# Patient Record
Sex: Female | Born: 1945
Health system: Southern US, Community
[De-identification: ages and names within clinical notes are randomized; demographics above are authoritative.]

## PROBLEM LIST (undated history)

## (undated) DIAGNOSIS — C439 Malignant melanoma of skin, unspecified: Secondary | ICD-10-CM

## (undated) DIAGNOSIS — M8580 Other specified disorders of bone density and structure, unspecified site: Secondary | ICD-10-CM

## (undated) HISTORY — DX: Other specified disorders of bone density and structure, unspecified site: M85.80

## (undated) HISTORY — DX: Malignant melanoma of skin, unspecified: C43.9

## (undated) HISTORY — PX: COLONOSCOPY: SHX174

---

## 1999-02-19 ENCOUNTER — Other Ambulatory Visit: Admission: RE | Admit: 1999-02-19 | Discharge: 1999-02-19 | Payer: Self-pay | Admitting: *Deleted

## 2000-03-08 ENCOUNTER — Other Ambulatory Visit: Admission: RE | Admit: 2000-03-08 | Discharge: 2000-03-08 | Payer: Self-pay | Admitting: *Deleted

## 2007-10-11 DIAGNOSIS — C439 Malignant melanoma of skin, unspecified: Secondary | ICD-10-CM

## 2007-10-11 HISTORY — PX: MELANOMA EXCISION: SHX5266

## 2007-10-11 HISTORY — DX: Malignant melanoma of skin, unspecified: C43.9

## 2008-01-22 ENCOUNTER — Ambulatory Visit: Payer: Self-pay | Admitting: Internal Medicine

## 2010-01-04 DIAGNOSIS — M810 Age-related osteoporosis without current pathological fracture: Secondary | ICD-10-CM | POA: Insufficient documentation

## 2010-01-04 DIAGNOSIS — Z87448 Personal history of other diseases of urinary system: Secondary | ICD-10-CM | POA: Insufficient documentation

## 2010-03-31 DIAGNOSIS — G4700 Insomnia, unspecified: Secondary | ICD-10-CM | POA: Insufficient documentation

## 2010-03-31 DIAGNOSIS — C4330 Malignant melanoma of unspecified part of face: Secondary | ICD-10-CM | POA: Insufficient documentation

## 2012-04-11 ENCOUNTER — Encounter: Payer: Self-pay | Admitting: Internal Medicine

## 2012-05-09 ENCOUNTER — Ambulatory Visit (AMBULATORY_SURGERY_CENTER): Payer: Medicare Other | Admitting: *Deleted

## 2012-05-09 VITALS — Ht 63.0 in | Wt 142.2 lb

## 2012-05-09 DIAGNOSIS — Z1211 Encounter for screening for malignant neoplasm of colon: Secondary | ICD-10-CM

## 2012-05-09 MED ORDER — MOVIPREP 100 G PO SOLR: ORAL | Status: DC

## 2012-05-23 ENCOUNTER — Encounter: Payer: Self-pay | Admitting: Internal Medicine

## 2012-05-28 ENCOUNTER — Encounter: Payer: Self-pay | Admitting: Internal Medicine

## 2012-05-28 ENCOUNTER — Ambulatory Visit (AMBULATORY_SURGERY_CENTER): Payer: Medicare Other | Admitting: Internal Medicine

## 2012-05-28 VITALS — BP 141/81 | HR 83 | Temp 98.5°F | Resp 17 | Ht 63.0 in | Wt 142.0 lb

## 2012-05-28 DIAGNOSIS — K5730 Diverticulosis of large intestine without perforation or abscess without bleeding: Secondary | ICD-10-CM

## 2012-05-28 DIAGNOSIS — Z1211 Encounter for screening for malignant neoplasm of colon: Secondary | ICD-10-CM | POA: Diagnosis not present

## 2012-05-28 MED ORDER — SODIUM CHLORIDE 0.9 % IV SOLN: 500.0000 mL | INTRAVENOUS | Status: DC

## 2012-05-28 NOTE — Patient Instructions (Signed)
Diverticulosis seen today, try to follow high fiber diet. See handouts on diverticulosis and high fiber diet. Repeat colonoscopy in 10 years. Call us with any questions or concerns. Thank you!!   YOU HAD AN ENDOSCOPIC PROCEDURE TODAY AT THE [facility]: Refer to the procedure report that was given to you for any specific questions about what was found during the examination.  If the procedure report does not answer your questions, please call your gastroenterologist to clarify.  If you requested that your care partner not be given the details of your procedure findings, then the procedure report has been included in a sealed envelope for you to review at your convenience later.  YOU SHOULD EXPECT: Some feelings of bloating in the abdomen. Passage of more gas than usual.  Walking can help get rid of the air that was put into your GI tract during the procedure and reduce the bloating. If you had a lower endoscopy (such as a colonoscopy or flexible sigmoidoscopy) you may notice spotting of blood in your stool or on the toilet paper. If you underwent a bowel prep for your procedure, then you may not have a normal bowel movement for a few days.  DIET: Your first meal following the procedure should be a light meal and then it is ok to progress to your normal diet.  A half-sandwich or bowl of soup is an example of a good first meal.  Heavy or fried foods are harder to digest and may make you feel nauseous or bloated.  Likewise meals heavy in dairy and vegetables can cause extra gas to form and this can also increase the bloating.  Drink plenty of fluids but you should avoid alcoholic beverages for 24 hours.  ACTIVITY: Your care partner should take you home directly after the procedure.  You should plan to take it easy, moving slowly for the rest of the day.  You can resume normal activity the day after the procedure however you should NOT DRIVE or use heavy machinery for 24 hours (because of the  sedation medicines used during the test).    SYMPTOMS TO REPORT IMMEDIATELY: A gastroenterologist can be reached at any hour.  During normal business hours, 8:30 AM to 5:00 PM Monday through Friday, call [phone].  After hours and on weekends, please call the GI answering service at [phone] who will take a message and have the physician on call contact you.   Following lower endoscopy (colonoscopy or flexible sigmoidoscopy):  Excessive amounts of blood in the stool  Significant tenderness or worsening of abdominal pains  Swelling of the abdomen that is new, acute  Fever of 100F or higher  FOLLOW UP: If any biopsies were taken you will be contacted by phone or by letter within the next 1-3 weeks.  Call your gastroenterologist if you have not heard about the biopsies in 3 weeks.  Our staff will call the home number listed on your records the next business day following your procedure to check on you and address any questions or concerns that you may have at that time regarding the information given to you following your procedure. This is a courtesy call and so if there is no answer at the home number and we have not heard from you through the emergency physician on call, we will assume that you have returned to your regular daily activities without incident.  SIGNATURES/CONFIDENTIALITY: You and/or your care partner have signed paperwork which will be entered into your electronic  medical record.  These signatures attest to the fact that that the information above on your After Visit Summary has been reviewed and is understood.  Full responsibility of the confidentiality of this discharge information lies with you and/or your care-partner.  

## 2012-05-28 NOTE — Op Note (Signed)
[facility] [address] [place], [address]   COLONOSCOPY PROCEDURE REPORT  PATIENT: Ashley, Hines  MR#: [id number] BIRTHDATE: 1946/03/12 , 65  yrs. old GENDER: Female ENDOSCOPIST: Roxy Cedar, MD REFERRED [id number] Recall PROCEDURE DATE:  05/28/2012 PROCEDURE:   Colonoscopy, screening ASA CLASS:   Class II INDICATIONS:average risk patient for colon cancer.  Index exam 2003 - negative MEDICATIONS: MAC sedation, administered by CRNA and propofol (Diprivan) 260mg  IV  DESCRIPTION OF PROCEDURE:   After the risks benefits and alternatives of the procedure were thoroughly explained, informed consent was obtained.  Digital rectal exam was performed and revealed A digital rectal exam revealed no abnormalities of the rectum.  The LB [id number]  endoscope was introduced through the anus  and advanced to the cecum, which was identified by both the appendix and ileocecal valve , limited by No adverse events experienced.   The quality of the prep was excellent, using MoviPrep . The instrument was then slowly withdrawn as the colon was fully examined.    FINDINGS:  COLON FINDINGS: Mild diverticulosis was noted in the sigmoid colon.   The colon mucosa was otherwise normal. Retroflexed views revealed internal hemorrhoid.      The time to cecum = 5:59 minutes minutes. The scope was then withdrawn in 8.13 minutes  minutes from the cecum and the procedure completed. COMPLICATIONS: There were no complications.  ENDOSCOPIC IMPRESSION: 1.   Mild diverticulosis was noted in the sigmoid colon 2.   The colon mucosa was otherwise normal  RECOMMENDATIONS: Continue current colorectal screening recommendations for "routine risk" patients with a repeat colonoscopy in 10 years.    _______________________________ eSignedRoxy Cedar, MD 05/28/2012 10:25 AM  ZD:GUYQ Timothy Lasso, MD The Patient   PATIENT NAME:  Ashley, Hines MR#: [id number]

## 2012-05-28 NOTE — Progress Notes (Signed)
Patient did not experience any of the following events: a burn prior to discharge; a fall within the facility; wrong site/side/patient/procedure/implant event; or a hospital transfer or hospital admission upon discharge from the facility. (G8907) Patient did not have preoperative order for IV antibiotic SSI prophylaxis. (G8918)  

## 2012-05-29 ENCOUNTER — Telehealth: Payer: Self-pay | Admitting: *Deleted

## 2012-05-29 NOTE — Telephone Encounter (Signed)
  Follow up Call-  Call back number 05/28/2012  Post procedure Call Back phone  # [phone]  Permission to leave phone message Yes     Patient questions:  Do you have a fever, pain , or abdominal swelling? no Pain Score  0 *  Have you tolerated food without any problems? yes  Have you been able to return to your normal activities? yes  Do you have any questions about your discharge instructions: Diet   no Medications  no Follow up visit  no  Do you have questions or concerns about your Care? no  Actions: * If pain score is 4 or above: No action needed, pain <4.  Information provided via husband.

## 2012-06-07 ENCOUNTER — Encounter: Payer: Self-pay | Admitting: Internal Medicine

## 2012-06-26 DIAGNOSIS — S5290XA Unspecified fracture of unspecified forearm, initial encounter for closed fracture: Secondary | ICD-10-CM | POA: Diagnosis not present

## 2012-06-26 DIAGNOSIS — W010XXA Fall on same level from slipping, tripping and stumbling without subsequent striking against object, initial encounter: Secondary | ICD-10-CM | POA: Diagnosis not present

## 2012-06-26 DIAGNOSIS — M810 Age-related osteoporosis without current pathological fracture: Secondary | ICD-10-CM | POA: Diagnosis not present

## 2012-06-28 DIAGNOSIS — S52599A Other fractures of lower end of unspecified radius, initial encounter for closed fracture: Secondary | ICD-10-CM | POA: Diagnosis not present

## 2012-06-29 ENCOUNTER — Other Ambulatory Visit: Payer: Self-pay | Admitting: Orthopedic Surgery

## 2012-06-29 ENCOUNTER — Encounter (HOSPITAL_BASED_OUTPATIENT_CLINIC_OR_DEPARTMENT_OTHER): Payer: Self-pay | Admitting: *Deleted

## 2012-06-29 NOTE — Progress Notes (Signed)
No cardiac or resp problems-fx wrist on tennis court-no labs needed Told to get wedding rings off

## 2012-07-02 ENCOUNTER — Ambulatory Visit (HOSPITAL_BASED_OUTPATIENT_CLINIC_OR_DEPARTMENT_OTHER)
Admission: RE | Admit: 2012-07-02 | Discharge: 2012-07-02 | Disposition: A | Discharge status: Home / Self Care | Payer: Medicare Other | Source: Ambulatory Visit | Attending: Orthopedic Surgery | Admitting: Orthopedic Surgery

## 2012-07-02 ENCOUNTER — Encounter (HOSPITAL_BASED_OUTPATIENT_CLINIC_OR_DEPARTMENT_OTHER)
Admission: RE | Disposition: A | Discharge status: Home / Self Care | Payer: Self-pay | Source: Ambulatory Visit | Attending: Orthopedic Surgery

## 2012-07-02 ENCOUNTER — Encounter (HOSPITAL_BASED_OUTPATIENT_CLINIC_OR_DEPARTMENT_OTHER): Payer: Self-pay | Admitting: Anesthesiology

## 2012-07-02 ENCOUNTER — Ambulatory Visit (HOSPITAL_BASED_OUTPATIENT_CLINIC_OR_DEPARTMENT_OTHER): Payer: Medicare Other | Admitting: Anesthesiology

## 2012-07-02 ENCOUNTER — Encounter (HOSPITAL_BASED_OUTPATIENT_CLINIC_OR_DEPARTMENT_OTHER): Payer: Self-pay

## 2012-07-02 DIAGNOSIS — Y9239 Other specified sports and athletic area as the place of occurrence of the external cause: Secondary | ICD-10-CM | POA: Insufficient documentation

## 2012-07-02 DIAGNOSIS — W010XXA Fall on same level from slipping, tripping and stumbling without subsequent striking against object, initial encounter: Secondary | ICD-10-CM | POA: Insufficient documentation

## 2012-07-02 DIAGNOSIS — Y92838 Other recreation area as the place of occurrence of the external cause: Secondary | ICD-10-CM | POA: Insufficient documentation

## 2012-07-02 DIAGNOSIS — Y9359 Activity, other involving other sports and athletics played individually: Secondary | ICD-10-CM | POA: Insufficient documentation

## 2012-07-02 DIAGNOSIS — S52599A Other fractures of lower end of unspecified radius, initial encounter for closed fracture: Secondary | ICD-10-CM | POA: Insufficient documentation

## 2012-07-02 DIAGNOSIS — M25539 Pain in unspecified wrist: Secondary | ICD-10-CM | POA: Diagnosis not present

## 2012-07-02 DIAGNOSIS — G8918 Other acute postprocedural pain: Secondary | ICD-10-CM | POA: Diagnosis not present

## 2012-07-02 LAB — POCT HEMOGLOBIN-HEMACUE: Hemoglobin: 14.4 g/dL (ref 12.0–15.0)

## 2012-07-02 SURGERY — OPEN REDUCTION INTERNAL FIXATION (ORIF) DISTAL RADIUS FRACTURE
Anesthesia: Regional | Site: Wrist | Laterality: Left | Wound class: Clean

## 2012-07-02 MED ORDER — LIDOCAINE HCL (CARDIAC) 20 MG/ML IV SOLN
INTRAVENOUS | Status: DC | PRN
  Administered 2012-07-02: 75 mg via INTRAVENOUS

## 2012-07-02 MED ORDER — ONDANSETRON HCL 4 MG/2ML IJ SOLN
INTRAMUSCULAR | Status: DC | PRN
  Administered 2012-07-02: 4 mg via INTRAVENOUS

## 2012-07-02 MED ORDER — HYDROMORPHONE HCL PF 1 MG/ML IJ SOLN: 0.2500 mg | INTRAMUSCULAR | Status: DC | PRN

## 2012-07-02 MED ORDER — OXYCODONE-ACETAMINOPHEN 7.5-500 MG PO TABS: 1.0000 | ORAL_TABLET | ORAL | Status: DC | PRN

## 2012-07-02 MED ORDER — CHLORHEXIDINE GLUCONATE 4 % EX LIQD: 60.0000 mL | Freq: Once | CUTANEOUS | Status: DC

## 2012-07-02 MED ORDER — ONDANSETRON HCL 4 MG/2ML IJ SOLN: 4.0000 mg | Freq: Four times a day (QID) | INTRAMUSCULAR | Status: DC | PRN

## 2012-07-02 MED ORDER — LACTATED RINGERS IV SOLN
INTRAVENOUS | Status: DC
  Administered 2012-07-02 (×2): via INTRAVENOUS

## 2012-07-02 MED ORDER — BUPIVACAINE-EPINEPHRINE PF 0.5-1:200000 % IJ SOLN
INTRAMUSCULAR | Status: DC | PRN
  Administered 2012-07-02: 30 mL

## 2012-07-02 MED ORDER — CEFAZOLIN SODIUM-DEXTROSE 2-3 GM-% IV SOLR
2.0000 g | INTRAVENOUS | Status: AC
  Administered 2012-07-02: 2 g via INTRAVENOUS

## 2012-07-02 MED ORDER — DEXAMETHASONE SODIUM PHOSPHATE 10 MG/ML IJ SOLN
INTRAMUSCULAR | Status: DC | PRN
  Administered 2012-07-02: 10 mg via INTRAVENOUS

## 2012-07-02 MED ORDER — FENTANYL CITRATE 0.05 MG/ML IJ SOLN
50.0000 ug | INTRAMUSCULAR | Status: DC | PRN
  Administered 2012-07-02: 50 ug via INTRAVENOUS

## 2012-07-02 MED ORDER — MIDAZOLAM HCL 2 MG/2ML IJ SOLN
1.0000 mg | INTRAMUSCULAR | Status: DC | PRN
  Administered 2012-07-02: 1 mg via INTRAVENOUS

## 2012-07-02 MED ORDER — PROPOFOL 10 MG/ML IV BOLUS
INTRAVENOUS | Status: DC | PRN
  Administered 2012-07-02: 200 mg via INTRAVENOUS

## 2012-07-02 SURGICAL SUPPLY — 67 items
BANDAGE GAUZE ELAST BULKY 4 IN (GAUZE/BANDAGES/DRESSINGS) ×2 IMPLANT
BIT DRILL 2 FAST STEP (BIT) ×2 IMPLANT
BIT DRILL 2.5X4 QC (BIT) ×2 IMPLANT
BLADE MINI RND TIP GREEN BEAV (BLADE) IMPLANT
BLADE SURG 15 STRL LF DISP TIS (BLADE) ×1 IMPLANT
BLADE SURG 15 STRL SS (BLADE) ×2
BNDG CMPR 9X4 STRL LF SNTH (GAUZE/BANDAGES/DRESSINGS) ×1
BNDG COHESIVE 3X5 TAN STRL LF (GAUZE/BANDAGES/DRESSINGS) ×2 IMPLANT
BNDG ESMARK 4X9 LF (GAUZE/BANDAGES/DRESSINGS) ×2 IMPLANT
CHLORAPREP W/TINT 26ML (MISCELLANEOUS) ×2 IMPLANT
CLOTH BEACON ORANGE TIMEOUT ST (SAFETY) ×2 IMPLANT
CORDS BIPOLAR (ELECTRODE) ×2 IMPLANT
COVER MAYO STAND STRL (DRAPES) ×2 IMPLANT
COVER TABLE BACK 60X90 (DRAPES) ×2 IMPLANT
CUFF TOURNIQUET SINGLE 18IN (TOURNIQUET CUFF) ×2 IMPLANT
DECANTER SPIKE VIAL GLASS SM (MISCELLANEOUS) IMPLANT
DRAPE EXTREMITY T 121X128X90 (DRAPE) ×2 IMPLANT
DRAPE OEC MINIVIEW 54X84 (DRAPES) ×2 IMPLANT
DRAPE SURG 17X23 STRL (DRAPES) ×2 IMPLANT
DRSG KUZMA FLUFF (GAUZE/BANDAGES/DRESSINGS) IMPLANT
ELECT REM PT RETURN 9FT ADLT (ELECTROSURGICAL)
ELECTRODE REM PT RTRN 9FT ADLT (ELECTROSURGICAL) IMPLANT
GAUZE XEROFORM 1X8 LF (GAUZE/BANDAGES/DRESSINGS) ×2 IMPLANT
GLOVE BIO SURGEON STRL SZ 6.5 (GLOVE) ×4 IMPLANT
GLOVE BIOGEL PI IND STRL 7.0 (GLOVE) ×2 IMPLANT
GLOVE BIOGEL PI IND STRL 8.5 (GLOVE) ×1 IMPLANT
GLOVE BIOGEL PI INDICATOR 7.0 (GLOVE) ×2
GLOVE BIOGEL PI INDICATOR 8.5 (GLOVE) ×1
GLOVE SURG ORTHO 8.0 STRL STRW (GLOVE) ×2 IMPLANT
GOWN BRE IMP PREV XXLGXLNG (GOWN DISPOSABLE) ×2 IMPLANT
GOWN PREVENTION PLUS XLARGE (GOWN DISPOSABLE) ×4 IMPLANT
KWIRE 4.0 X .045IN (WIRE) IMPLANT
NS IRRIG 1000ML POUR BTL (IV SOLUTION) ×2 IMPLANT
PACK BASIN DAY SURGERY FS (CUSTOM PROCEDURE TRAY) ×2 IMPLANT
PAD CAST 3X4 CTTN HI CHSV (CAST SUPPLIES) ×1 IMPLANT
PADDING CAST ABS 3INX4YD NS (CAST SUPPLIES)
PADDING CAST ABS 4INX4YD NS (CAST SUPPLIES) ×1
PADDING CAST ABS COTTON 3X4 (CAST SUPPLIES) IMPLANT
PADDING CAST ABS COTTON 4X4 ST (CAST SUPPLIES) ×1 IMPLANT
PADDING CAST COTTON 3X4 STRL (CAST SUPPLIES) ×2
PEG FULLY THREADED 2.5X22MM (Peg) ×2 IMPLANT
PEG SUBCHONDRAL SMOOTH 2.0X18 (Peg) ×14 IMPLANT
PENCIL BUTTON HOLSTER BLD 10FT (ELECTRODE) IMPLANT
PLATE STAN 24.4X59.5 LT (Plate) ×2 IMPLANT
SCREW BN 12X3.5XNS CORT TI (Screw) ×2 IMPLANT
SCREW BN 13X3.5XNS CORT TI (Screw) ×2 IMPLANT
SCREW CORT 3.5X12 (Screw) ×4 IMPLANT
SCREW CORT 3.5X13 (Screw) ×4 IMPLANT
SCREW PEG 2.5X22 NONLOCK (Screw) ×2 IMPLANT
SLEEVE SCD COMPRESS KNEE MED (MISCELLANEOUS) ×2 IMPLANT
SPLINT PLASTER CAST XFAST 3X15 (CAST SUPPLIES) ×10 IMPLANT
SPLINT PLASTER XTRA FASTSET 3X (CAST SUPPLIES) ×10
SPONGE GAUZE 4X4 12PLY (GAUZE/BANDAGES/DRESSINGS) ×2 IMPLANT
STOCKINETTE 4X48 STRL (DRAPES) ×2 IMPLANT
SUT VIC AB 0 CT1 27 (SUTURE)
SUT VIC AB 0 CT1 27XBRD ANBCTR (SUTURE) IMPLANT
SUT VIC AB 2-0 SH 27 (SUTURE)
SUT VIC AB 2-0 SH 27XBRD (SUTURE) IMPLANT
SUT VIC AB 3-0 FS2 27 (SUTURE) ×2 IMPLANT
SUT VICRYL 4-0 PS2 18IN ABS (SUTURE) IMPLANT
SUT VICRYL RAPID 5 0 P 3 (SUTURE) IMPLANT
SUT VICRYL RAPIDE 4/0 PS 2 (SUTURE) ×4 IMPLANT
SYR BULB 3OZ (MISCELLANEOUS) ×2 IMPLANT
SYR CONTROL 10ML LL (SYRINGE) IMPLANT
TOWEL OR 17X24 6PK STRL BLUE (TOWEL DISPOSABLE) ×2 IMPLANT
UNDERPAD 30X30 INCONTINENT (UNDERPADS AND DIAPERS) ×2 IMPLANT
WATER STERILE IRR 1000ML POUR (IV SOLUTION) IMPLANT

## 2012-07-02 NOTE — Transfer of Care (Signed)
Immediate Anesthesia Transfer of Care Note  Patient: Ashley Hines  Procedure(s) Performed: Procedure(s) (LRB) with comments: OPEN REDUCTION INTERNAL FIXATION (ORIF) DISTAL RADIAL FRACTURE (Left)  Patient Location: PACU  Anesthesia Type: General  Level of Consciousness: sedated  Airway & Oxygen Therapy: Patient Spontanous Breathing and Patient connected to face mask oxygen  Post-op Assessment: Report given to PACU RN and Post -op Vital signs reviewed and stable  Post vital signs: Reviewed and stable  Complications: No apparent anesthesia complications

## 2012-07-02 NOTE — Op Note (Signed)
Dictation Number [id number]

## 2012-07-02 NOTE — Progress Notes (Signed)
Assisted Dr. Hodierne with left, ultrasound guided, interscalene  block. Side rails up, monitors on throughout procedure. See vital signs in flow sheet. Tolerated Procedure well. 

## 2012-07-02 NOTE — Brief Op Note (Signed)
07/02/2012  3:55 PM  PATIENT:  Ashley Hines  66 y.o. female  PRE-OPERATIVE DIAGNOSIS:  distal radius comminuted fracture, left   POST-OPERATIVE DIAGNOSIS:  distal radius comminuted fracture, left   PROCEDURE:  Procedure(s) (LRB) with comments: OPEN REDUCTION INTERNAL FIXATION (ORIF) DISTAL RADIAL FRACTURE (Left)  SURGEON:  Surgeon(s) and Role:    * Nicki Reaper, MD - Primary  PHYSICIAN ASSISTANT:   ASSISTANTS: none   ANESTHESIA:   regional and general  EBL:  Total I/O In: 1000 [I.V.:1000] Out: -   BLOOD ADMINISTERED:none  DRAINS: none   LOCAL MEDICATIONS USED:  NONE  SPECIMEN:  No Specimen  DISPOSITION OF SPECIMEN:  N/A  COUNTS:  YES  TOURNIQUET:   Total Tourniquet Time Documented: Upper Arm (Left) - 61 minutes  DICTATION: .Other Dictation: Dictation Number [id number]  PLAN OF CARE: Discharge to home after PACU  PATIENT DISPOSITION:  PACU - hemodynamically stable.

## 2012-07-02 NOTE — H&P (Signed)
Ashley Hines is a 66 year-old right-hand dominant female referred by Dr. Gardiner [facility]) for consultation with respect to fracture of her distal radius left hand.  She suffered a fall while playing tennis on 06/26/12 and was seen at [facility], put in a brace and referred.  X-rays revealed comminuted intraarticular fracture of her distal radius with displacement.  She has no prior history of injuries. She is not given any medicine to take.  She complains of a dull aching throbbing pain.  She has been taking Advil.  She has no history of diabetes, thyroid problems, arthritis or gout.  ALLERGIES:   None.  MEDICATIONS:    Fosamax.  SURGICAL HISTORY:    C-sections x 2.  FAMILY MEDICAL HISTORY:   Negative.  SOCIAL HISTORY:    She does not smoke, drinks socially. She is married, housewife.  REVIEW OF SYSTEMS:    Negative 14 points. Ashley Hines is an 66 y.o. female.   Chief Complaint: Fracture left distal radius HPI: see above  Past Medical History  Diagnosis Date  . Osteopenia   . Melanoma 2009    face    Past Surgical History  Procedure Date  . Cesarean section 1983, 1986  . Melanoma excision 2009    face  . Colonoscopy     Family History  Problem Relation Age of Onset  . Colon cancer Neg Hx   . Stomach cancer Neg Hx    Social History:  reports that she has never smoked. She has never used smokeless tobacco. She reports that she drinks about 4.2 ounces of alcohol per week. She reports that she does not use illicit drugs.  Allergies: No Known Allergies  Medications Prior to Admission  Medication Sig Dispense Refill  . acetaminophen (TYLENOL) 500 MG tablet Take 1,000 mg by mouth every 6 (six) hours as needed.      Marland Kitchen alendronate (FOSAMAX) 70 MG tablet Take 70 mg by mouth every 7 (seven) days.       . B Complex Vitamins (B COMPLEX 100 PO) Take by mouth daily.      . calcium carbonate (OS-CAL) 600 MG TABS Take 600 mg by mouth 2 (two) times daily with a meal.       . Cranberry-Cholecalciferol 4200-500 MG-UNIT CAPS Take by mouth daily.      Marland Kitchen HYDROcodone-acetaminophen (VICODIN) 5-500 MG per tablet Take 1 tablet by mouth every 6 (six) hours as needed.      Marland Kitchen ibuprofen (ADVIL,MOTRIN) 200 MG tablet Take 200 mg by mouth every 6 (six) hours as needed.      . zolpidem (AMBIEN) 5 MG tablet Take 5 mg by mouth at bedtime as needed.         Results for orders placed during the hospital encounter of 07/02/12 (from the past 48 hour(s))  POCT HEMOGLOBIN-HEMACUE     Status: Normal   Collection Time   07/02/12  1:00 PM      Component Value Range Comment   Hemoglobin 14.4  12.0 - 15.0 g/dL     No results found.   Pertinent items are noted in HPI.  Blood pressure 153/89, pulse 80, temperature 98.1 F (36.7 C), temperature source Oral, resp. rate 20, height 5\' 3"  (1.6 m), weight 63.22 kg (139 lb 6 oz), SpO2 98.00%.  General appearance: alert, cooperative and appears stated age Head: Normocephalic, without obvious abnormality Neck: no adenopathy Resp: clear to auscultation bilaterally Cardio: regular rate and rhythm, S1, S2 normal, no murmur, click, rub  or gallop GI: soft, non-tender; bowel sounds normal; no masses,  no organomegaly Extremities: extremities normal, atraumatic, no cyanosis or edema Pulses: 2+ and symmetric Skin: Skin color, texture, turgor normal. No rashes or lesions Neurologic: Grossly normal Incision/Wound: na  Assessment/Plan RADIOGRAPHS:    X-rays reveal a comminuted intraarticular fracture of her left distal radius.   RECOMMENDATIONS/PLAN:   Plan is for open reduction internal fixation with possibility of bone graft.  The pre, peri and postoperative course were discussed along with the risks and complications including infection, recurrence and nonhealing.  She is seen with her husband.  They would like to proceed.   Travares Nelles R 07/02/2012, 1:26 PM

## 2012-07-02 NOTE — Anesthesia Procedure Notes (Addendum)
Anesthesia Regional Block:  Supraclavicular block  Pre-Anesthetic Checklist: ,, timeout performed, Correct Patient, Correct Site, Correct Laterality, Correct Procedure, Correct Position, site marked, Risks and benefits discussed,  Surgical consent,  Pre-op evaluation,  At surgeon's request and post-op pain management  Laterality: Left  Prep: chloraprep       Needles:  Injection technique: Single-shot  Needle Type: Echogenic Stimulator Needle     Needle Length: 5cm 5 cm Needle Gauge: 22 and 22 G    Additional Needles:  Procedures: ultrasound guided and nerve stimulator Supraclavicular block  Nerve Stimulator or Paresthesia:  Response: biceps flexion, 0.45 mA,   Additional Responses:   Narrative:  Start time: 07/02/2012 1:55 PM End time: 07/02/2012 2:10 PM Injection made incrementally with aspirations every 5 mL.  Performed by: Personally  Anesthesiologist: Dr Chaney Malling  Additional Notes: Functioning IV was confirmed and monitors were applied.  A 50mm 22ga Arrow echogenic stimulator needle was used. Sterile prep and drape,hand hygiene and sterile gloves were used.  Negative aspiration and negative test dose prior to incremental administration of local anesthetic. The patient tolerated the procedure well.  Ultrasound guidance: relevent anatomy identified, needle position confirmed, local anesthetic spread visualized around nerve(s), vascular puncture avoided.  Image printed for medical record.   Supraclavicular block Procedure Name: LMA Insertion Performed by: Lance Coon Pre-anesthesia Checklist: Patient identified, Timeout performed, Emergency Drugs available, Suction available and Patient being monitored Patient Re-evaluated:Patient Re-evaluated prior to inductionOxygen Delivery Method: Circle system utilized Preoxygenation: Pre-oxygenation with 100% oxygen Intubation Type: IV induction Ventilation: Mask ventilation without difficulty LMA: LMA inserted LMA Size:  4.0 Number of attempts: 1 Placement Confirmation: breath sounds checked- equal and bilateral and positive ETCO2 Dental Injury: Teeth and Oropharynx as per pre-operative assessment

## 2012-07-02 NOTE — Anesthesia Postprocedure Evaluation (Signed)
  Anesthesia Post-op Note  Patient: Ashley Hines  Procedure(s) Performed: Procedure(s) (LRB) with comments: OPEN REDUCTION INTERNAL FIXATION (ORIF) DISTAL RADIAL FRACTURE (Left)  Patient Location: PACU  Anesthesia Type: GA combined with regional for post-op pain  Level of Consciousness: awake, alert , oriented and patient cooperative  Airway and Oxygen Therapy: Patient Spontanous Breathing  Post-op Pain: none  Post-op Assessment: Post-op Vital signs reviewed, Patient's Cardiovascular Status Stable, Respiratory Function Stable, Patent Airway, No signs of Nausea or vomiting and Pain level controlled  Post-op Vital Signs: Reviewed and stable  Complications: No apparent anesthesia complications

## 2012-07-02 NOTE — Anesthesia Preprocedure Evaluation (Signed)
Anesthesia Evaluation  Patient identified by MRN, date of birth, ID band Patient awake    Reviewed: Allergy & Precautions, H&P , NPO status , Patient's Chart, lab work & pertinent test results  Airway Mallampati: II  Neck ROM: full    Dental   Pulmonary          Cardiovascular     Neuro/Psych    GI/Hepatic   Endo/Other    Renal/GU      Musculoskeletal   Abdominal   Peds  Hematology   Anesthesia Other Findings   Reproductive/Obstetrics                           Anesthesia Physical Anesthesia Plan  ASA: II  Anesthesia Plan: General and Regional   Post-op Pain Management: MAC Combined w/ Regional for Post-op pain   Induction: Intravenous  Airway Management Planned: LMA  Additional Equipment:   Intra-op Plan:   Post-operative Plan:   Informed Consent: I have reviewed the patients History and Physical, chart, labs and discussed the procedure including the risks, benefits and alternatives for the proposed anesthesia with the patient or authorized representative who has indicated his/her understanding and acceptance.     Plan Discussed with: CRNA and Surgeon  Anesthesia Plan Comments:         Anesthesia Quick Evaluation  

## 2012-07-03 NOTE — Op Note (Signed)
Ashley Hines, Ashley Hines NO.:  [id number]  MEDICAL RECORD NO.:  [id number]  LOCATION:                                 FACILITY:  PHYSICIAN:  Cindee Salt, M.D.            DATE OF BIRTH:  DATE OF PROCEDURE:  07/02/2012 DATE OF DISCHARGE:                              OPERATIVE REPORT   PREOPERATIVE DIAGNOSIS:  Comminuted intra-articular fracture, left distal radius.  POSTOPERATIVE DIAGNOSIS:  Comminuted intra-articular fracture, left distal radius.  OPERATION:  Open reduction and internal fixation of left distal radius.  SURGEON:  Cindee Salt, MD  ANESTHESIA:  General with supraclavicular block.  ANESTHESIOLOGIST:  Achille Rich, MD  HISTORY:  The patient is a 66 year old female who suffered a fall, and suffering comminuted intra-articular fracture of her left distal radius. She is admitted for open reduction and internal fixation and this is comminuted with significant loss of angulation.  Pre, peri, and postoperative course have been discussed along with risks and complications.  She is aware that there is no guarantee with the surgery; possibility of infection; recurrence of injury to arteries, nerves, tendons; incomplete relief of symptoms; dystrophy; nonunion; loss of fixation.  Pre, peri, and postoperative course have been discussed.  PROCEDURE:  The patient was brought to the operating room after a supraclavicular block was carried out without difficulty.  A general anesthetic was given.  She was prepped using ChloraPrep, supine position with the left arm free.  A 3-minute dry time was allowed.  Time-out taken, confirming the patient and procedure.  The limb was exsanguinated with an Esmarch bandage.  Tourniquet was placed high on the arm was inflated to 250 mmHg.  A volar radial incision was made for placement a distal volar radial plate.  [employer] standard plate had been selected, the dissection was carried down through the flexor  carpi radialis tendon.  The pronator quadratus was incised, elevated.  The brachioradialis was incised taking care to protect the radial artery, another neurovascular structures.  After elevation of the pronator quadratus, the comminution was immediately apparent.  The standard plate was selected and placed, this lied in good position with good coverage of the entire distal radius.  This was pinned, found to be slightly proximal, this was readjusted, retained with 4.5 K-wires.  X-rays confirmed positioning.  The gliding screw was then placed, this measured 13 mm, this was tightened.  The fracture was manipulated to remove any radial or ulnar angulatory deformity, lied in good position on x-rays and the distal screws were then placed.  The most ulnar screw was placed first, this was a fully-threaded nonlocking screw to bring the fracture down to the plate, this measured 22 mm, this was placed.  The remaining drill holes were placed after x-rays confirmed that this maintained the fracture in good position with good alignment in both AP and lateral directions.  Each of the next screws measured 20 mm and 18 mm.  Locking pegs were placed in all, but the most radial which took a 22-mm fully threaded locking screw.  X-rays in the AP and lateral direction revealed the plate in good position.  None of the  screws entered into the joint. The fracture was reduced with good length and good volar tilt.  A 20- degree oblique was taken along with several obliques to be certain that there was no screws in the joint.  The nonlocking screw was then replaced with an 18-mm locking peg.  The proximal drill holes were placed, these measured 13, 12, and 12 mm.  This firmly fixed the fracture in place.  Pronation and supination was complete with full flexion and extension.  X-rays revealed the plate, screws and fracture in good position.  The wound was copiously irrigated with saline.  The pronator quadratus was  repaired as much as possible with figure-of-eight 2-0 Vicryl sutures, the subcutaneous tissue with interrupted 2-0 Vicryl and the skin with interrupted 4-0 Vicryl Rapide.  A sterile compressive dressing, dorsal palmar splint applied.  On deflation of the tourniquet, all fingers were immediately pinked.  She was taken to the recovery room for observation in satisfactory condition.  She will be discharged on Percocet to return in 1 week.          ______________________________ Cindee Salt, M.D.     GK/MEDQ  D:  07/02/2012  T:  07/03/2012  Job:  [id number]

## 2012-07-09 DIAGNOSIS — S52599A Other fractures of lower end of unspecified radius, initial encounter for closed fracture: Secondary | ICD-10-CM | POA: Diagnosis not present

## 2012-07-16 DIAGNOSIS — S52599A Other fractures of lower end of unspecified radius, initial encounter for closed fracture: Secondary | ICD-10-CM | POA: Diagnosis not present

## 2012-08-06 DIAGNOSIS — S52599A Other fractures of lower end of unspecified radius, initial encounter for closed fracture: Secondary | ICD-10-CM | POA: Diagnosis not present

## 2012-08-23 DIAGNOSIS — H40019 Open angle with borderline findings, low risk, unspecified eye: Secondary | ICD-10-CM | POA: Diagnosis not present

## 2012-09-03 DIAGNOSIS — S52599A Other fractures of lower end of unspecified radius, initial encounter for closed fracture: Secondary | ICD-10-CM | POA: Diagnosis not present

## 2012-09-10 DIAGNOSIS — H40019 Open angle with borderline findings, low risk, unspecified eye: Secondary | ICD-10-CM | POA: Diagnosis not present

## 2012-09-10 DIAGNOSIS — H40059 Ocular hypertension, unspecified eye: Secondary | ICD-10-CM | POA: Diagnosis not present

## 2012-10-01 DIAGNOSIS — S52599A Other fractures of lower end of unspecified radius, initial encounter for closed fracture: Secondary | ICD-10-CM | POA: Diagnosis not present

## 2012-11-13 DIAGNOSIS — M949 Disorder of cartilage, unspecified: Secondary | ICD-10-CM | POA: Diagnosis not present

## 2012-11-13 DIAGNOSIS — M899 Disorder of bone, unspecified: Secondary | ICD-10-CM | POA: Diagnosis not present

## 2013-03-20 DIAGNOSIS — L819 Disorder of pigmentation, unspecified: Secondary | ICD-10-CM | POA: Diagnosis not present

## 2013-03-20 DIAGNOSIS — D234 Other benign neoplasm of skin of scalp and neck: Secondary | ICD-10-CM | POA: Diagnosis not present

## 2013-03-20 DIAGNOSIS — L821 Other seborrheic keratosis: Secondary | ICD-10-CM | POA: Diagnosis not present

## 2013-03-20 DIAGNOSIS — Z85820 Personal history of malignant melanoma of skin: Secondary | ICD-10-CM | POA: Diagnosis not present

## 2013-03-20 DIAGNOSIS — I789 Disease of capillaries, unspecified: Secondary | ICD-10-CM | POA: Diagnosis not present

## 2013-03-20 DIAGNOSIS — D239 Other benign neoplasm of skin, unspecified: Secondary | ICD-10-CM | POA: Diagnosis not present

## 2013-03-20 DIAGNOSIS — D219 Benign neoplasm of connective and other soft tissue, unspecified: Secondary | ICD-10-CM | POA: Diagnosis not present

## 2013-04-02 DIAGNOSIS — H40019 Open angle with borderline findings, low risk, unspecified eye: Secondary | ICD-10-CM | POA: Diagnosis not present

## 2013-04-04 DIAGNOSIS — M949 Disorder of cartilage, unspecified: Secondary | ICD-10-CM | POA: Diagnosis not present

## 2013-04-04 DIAGNOSIS — R82998 Other abnormal findings in urine: Secondary | ICD-10-CM | POA: Diagnosis not present

## 2013-04-04 DIAGNOSIS — M899 Disorder of bone, unspecified: Secondary | ICD-10-CM | POA: Diagnosis not present

## 2013-04-11 DIAGNOSIS — G4700 Insomnia, unspecified: Secondary | ICD-10-CM | POA: Diagnosis not present

## 2013-04-11 DIAGNOSIS — Z87448 Personal history of other diseases of urinary system: Secondary | ICD-10-CM | POA: Diagnosis not present

## 2013-04-11 DIAGNOSIS — Z23 Encounter for immunization: Secondary | ICD-10-CM | POA: Diagnosis not present

## 2013-04-11 DIAGNOSIS — C4330 Malignant melanoma of unspecified part of face: Secondary | ICD-10-CM | POA: Diagnosis not present

## 2013-04-11 DIAGNOSIS — Z1389 Encounter for screening for other disorder: Secondary | ICD-10-CM | POA: Insufficient documentation

## 2013-04-11 DIAGNOSIS — Z0000 Encounter for general adult medical examination without abnormal findings: Secondary | ICD-10-CM | POA: Diagnosis not present

## 2013-04-11 DIAGNOSIS — Z1212 Encounter for screening for malignant neoplasm of rectum: Secondary | ICD-10-CM | POA: Diagnosis not present

## 2013-04-11 DIAGNOSIS — Z1331 Encounter for screening for depression: Secondary | ICD-10-CM | POA: Diagnosis not present

## 2013-04-11 DIAGNOSIS — M810 Age-related osteoporosis without current pathological fracture: Secondary | ICD-10-CM | POA: Diagnosis not present

## 2013-05-01 DIAGNOSIS — Z1231 Encounter for screening mammogram for malignant neoplasm of breast: Secondary | ICD-10-CM | POA: Diagnosis not present

## 2013-07-05 DIAGNOSIS — N390 Urinary tract infection, site not specified: Secondary | ICD-10-CM | POA: Diagnosis not present

## 2013-07-05 DIAGNOSIS — R82998 Other abnormal findings in urine: Secondary | ICD-10-CM | POA: Diagnosis not present

## 2014-03-18 DIAGNOSIS — D1801 Hemangioma of skin and subcutaneous tissue: Secondary | ICD-10-CM | POA: Diagnosis not present

## 2014-03-18 DIAGNOSIS — D239 Other benign neoplasm of skin, unspecified: Secondary | ICD-10-CM | POA: Diagnosis not present

## 2014-03-18 DIAGNOSIS — L821 Other seborrheic keratosis: Secondary | ICD-10-CM | POA: Diagnosis not present

## 2014-03-18 DIAGNOSIS — D219 Benign neoplasm of connective and other soft tissue, unspecified: Secondary | ICD-10-CM | POA: Diagnosis not present

## 2014-03-18 DIAGNOSIS — D234 Other benign neoplasm of skin of scalp and neck: Secondary | ICD-10-CM | POA: Diagnosis not present

## 2014-03-18 DIAGNOSIS — L819 Disorder of pigmentation, unspecified: Secondary | ICD-10-CM | POA: Diagnosis not present

## 2014-03-18 DIAGNOSIS — Z85820 Personal history of malignant melanoma of skin: Secondary | ICD-10-CM | POA: Diagnosis not present

## 2014-04-15 DIAGNOSIS — Z79899 Other long term (current) drug therapy: Secondary | ICD-10-CM | POA: Diagnosis not present

## 2014-04-15 DIAGNOSIS — M899 Disorder of bone, unspecified: Secondary | ICD-10-CM | POA: Diagnosis not present

## 2014-04-15 DIAGNOSIS — R82998 Other abnormal findings in urine: Secondary | ICD-10-CM | POA: Diagnosis not present

## 2014-04-15 DIAGNOSIS — Z0000 Encounter for general adult medical examination without abnormal findings: Secondary | ICD-10-CM | POA: Diagnosis not present

## 2014-04-15 DIAGNOSIS — M949 Disorder of cartilage, unspecified: Secondary | ICD-10-CM | POA: Diagnosis not present

## 2014-04-22 DIAGNOSIS — Z6825 Body mass index (BMI) 25.0-25.9, adult: Secondary | ICD-10-CM | POA: Diagnosis not present

## 2014-04-22 DIAGNOSIS — Z0000 Encounter for general adult medical examination without abnormal findings: Secondary | ICD-10-CM | POA: Diagnosis not present

## 2014-04-22 DIAGNOSIS — C4330 Malignant melanoma of unspecified part of face: Secondary | ICD-10-CM | POA: Diagnosis not present

## 2014-04-22 DIAGNOSIS — Z1331 Encounter for screening for depression: Secondary | ICD-10-CM | POA: Diagnosis not present

## 2014-04-22 DIAGNOSIS — M810 Age-related osteoporosis without current pathological fracture: Secondary | ICD-10-CM | POA: Diagnosis not present

## 2014-04-22 DIAGNOSIS — G4700 Insomnia, unspecified: Secondary | ICD-10-CM | POA: Diagnosis not present

## 2014-04-24 DIAGNOSIS — Z1212 Encounter for screening for malignant neoplasm of rectum: Secondary | ICD-10-CM | POA: Diagnosis not present

## 2014-05-05 DIAGNOSIS — Z1231 Encounter for screening mammogram for malignant neoplasm of breast: Secondary | ICD-10-CM | POA: Diagnosis not present

## 2014-05-28 DIAGNOSIS — R82998 Other abnormal findings in urine: Secondary | ICD-10-CM | POA: Diagnosis not present

## 2014-05-28 DIAGNOSIS — N390 Urinary tract infection, site not specified: Secondary | ICD-10-CM | POA: Diagnosis not present

## 2014-05-28 DIAGNOSIS — Z87448 Personal history of other diseases of urinary system: Secondary | ICD-10-CM | POA: Diagnosis not present

## 2014-06-04 DIAGNOSIS — Z87448 Personal history of other diseases of urinary system: Secondary | ICD-10-CM | POA: Diagnosis not present

## 2014-09-18 DIAGNOSIS — H40013 Open angle with borderline findings, low risk, bilateral: Secondary | ICD-10-CM | POA: Diagnosis not present

## 2014-12-23 DIAGNOSIS — M810 Age-related osteoporosis without current pathological fracture: Secondary | ICD-10-CM | POA: Diagnosis not present

## 2015-03-24 DIAGNOSIS — D224 Melanocytic nevi of scalp and neck: Secondary | ICD-10-CM | POA: Diagnosis not present

## 2015-03-24 DIAGNOSIS — D1801 Hemangioma of skin and subcutaneous tissue: Secondary | ICD-10-CM | POA: Diagnosis not present

## 2015-03-24 DIAGNOSIS — Z85820 Personal history of malignant melanoma of skin: Secondary | ICD-10-CM | POA: Diagnosis not present

## 2015-03-24 DIAGNOSIS — L814 Other melanin hyperpigmentation: Secondary | ICD-10-CM | POA: Diagnosis not present

## 2015-03-24 DIAGNOSIS — L821 Other seborrheic keratosis: Secondary | ICD-10-CM | POA: Diagnosis not present

## 2015-04-02 DIAGNOSIS — H40013 Open angle with borderline findings, low risk, bilateral: Secondary | ICD-10-CM | POA: Diagnosis not present

## 2015-04-27 DIAGNOSIS — M810 Age-related osteoporosis without current pathological fracture: Secondary | ICD-10-CM | POA: Diagnosis not present

## 2015-04-27 DIAGNOSIS — Z79899 Other long term (current) drug therapy: Secondary | ICD-10-CM | POA: Diagnosis not present

## 2015-05-04 DIAGNOSIS — Z87448 Personal history of other diseases of urinary system: Secondary | ICD-10-CM | POA: Diagnosis not present

## 2015-05-04 DIAGNOSIS — G4700 Insomnia, unspecified: Secondary | ICD-10-CM | POA: Diagnosis not present

## 2015-05-04 DIAGNOSIS — Z0000 Encounter for general adult medical examination without abnormal findings: Secondary | ICD-10-CM | POA: Diagnosis not present

## 2015-05-04 DIAGNOSIS — C4330 Malignant melanoma of unspecified part of face: Secondary | ICD-10-CM | POA: Diagnosis not present

## 2015-05-04 DIAGNOSIS — Z6826 Body mass index (BMI) 26.0-26.9, adult: Secondary | ICD-10-CM | POA: Diagnosis not present

## 2015-05-04 DIAGNOSIS — M810 Age-related osteoporosis without current pathological fracture: Secondary | ICD-10-CM | POA: Diagnosis not present

## 2015-05-04 DIAGNOSIS — Z1389 Encounter for screening for other disorder: Secondary | ICD-10-CM | POA: Diagnosis not present

## 2015-05-05 DIAGNOSIS — Z1212 Encounter for screening for malignant neoplasm of rectum: Secondary | ICD-10-CM | POA: Diagnosis not present

## 2015-06-03 DIAGNOSIS — Z1231 Encounter for screening mammogram for malignant neoplasm of breast: Secondary | ICD-10-CM | POA: Diagnosis not present

## 2015-07-13 DIAGNOSIS — Z23 Encounter for immunization: Secondary | ICD-10-CM | POA: Diagnosis not present

## 2015-10-27 DIAGNOSIS — H40013 Open angle with borderline findings, low risk, bilateral: Secondary | ICD-10-CM | POA: Diagnosis not present

## 2016-01-18 DIAGNOSIS — R300 Dysuria: Secondary | ICD-10-CM | POA: Insufficient documentation

## 2016-01-18 DIAGNOSIS — N390 Urinary tract infection, site not specified: Secondary | ICD-10-CM | POA: Diagnosis not present

## 2016-01-25 DIAGNOSIS — R300 Dysuria: Secondary | ICD-10-CM | POA: Diagnosis not present

## 2016-01-25 DIAGNOSIS — N390 Urinary tract infection, site not specified: Secondary | ICD-10-CM | POA: Diagnosis not present

## 2016-01-25 DIAGNOSIS — R8290 Unspecified abnormal findings in urine: Secondary | ICD-10-CM | POA: Diagnosis not present

## 2016-04-01 DIAGNOSIS — D224 Melanocytic nevi of scalp and neck: Secondary | ICD-10-CM | POA: Diagnosis not present

## 2016-04-01 DIAGNOSIS — L821 Other seborrheic keratosis: Secondary | ICD-10-CM | POA: Diagnosis not present

## 2016-04-01 DIAGNOSIS — Z85820 Personal history of malignant melanoma of skin: Secondary | ICD-10-CM | POA: Diagnosis not present

## 2016-04-01 DIAGNOSIS — L814 Other melanin hyperpigmentation: Secondary | ICD-10-CM | POA: Diagnosis not present

## 2016-04-01 DIAGNOSIS — L570 Actinic keratosis: Secondary | ICD-10-CM | POA: Diagnosis not present

## 2016-04-27 DIAGNOSIS — H40053 Ocular hypertension, bilateral: Secondary | ICD-10-CM | POA: Diagnosis not present

## 2016-04-27 DIAGNOSIS — H40013 Open angle with borderline findings, low risk, bilateral: Secondary | ICD-10-CM | POA: Diagnosis not present

## 2016-05-03 DIAGNOSIS — M810 Age-related osteoporosis without current pathological fracture: Secondary | ICD-10-CM | POA: Diagnosis not present

## 2016-05-03 DIAGNOSIS — Z79899 Other long term (current) drug therapy: Secondary | ICD-10-CM | POA: Diagnosis not present

## 2016-05-10 DIAGNOSIS — R300 Dysuria: Secondary | ICD-10-CM | POA: Diagnosis not present

## 2016-05-10 DIAGNOSIS — Z87448 Personal history of other diseases of urinary system: Secondary | ICD-10-CM | POA: Diagnosis not present

## 2016-05-10 DIAGNOSIS — Z23 Encounter for immunization: Secondary | ICD-10-CM | POA: Diagnosis not present

## 2016-05-10 DIAGNOSIS — M810 Age-related osteoporosis without current pathological fracture: Secondary | ICD-10-CM | POA: Diagnosis not present

## 2016-05-10 DIAGNOSIS — Z1389 Encounter for screening for other disorder: Secondary | ICD-10-CM | POA: Diagnosis not present

## 2016-05-10 DIAGNOSIS — Z6825 Body mass index (BMI) 25.0-25.9, adult: Secondary | ICD-10-CM | POA: Diagnosis not present

## 2016-05-10 DIAGNOSIS — G4709 Other insomnia: Secondary | ICD-10-CM | POA: Diagnosis not present

## 2016-05-10 DIAGNOSIS — C4330 Malignant melanoma of unspecified part of face: Secondary | ICD-10-CM | POA: Diagnosis not present

## 2016-05-10 DIAGNOSIS — Z0000 Encounter for general adult medical examination without abnormal findings: Secondary | ICD-10-CM | POA: Diagnosis not present

## 2016-05-16 DIAGNOSIS — Z1212 Encounter for screening for malignant neoplasm of rectum: Secondary | ICD-10-CM | POA: Diagnosis not present

## 2016-06-08 DIAGNOSIS — Z1231 Encounter for screening mammogram for malignant neoplasm of breast: Secondary | ICD-10-CM | POA: Diagnosis not present

## 2016-06-29 DIAGNOSIS — N390 Urinary tract infection, site not specified: Secondary | ICD-10-CM | POA: Diagnosis not present

## 2016-06-29 DIAGNOSIS — R300 Dysuria: Secondary | ICD-10-CM | POA: Diagnosis not present

## 2016-06-29 DIAGNOSIS — R8299 Other abnormal findings in urine: Secondary | ICD-10-CM | POA: Diagnosis not present

## 2016-09-03 DIAGNOSIS — Z23 Encounter for immunization: Secondary | ICD-10-CM | POA: Diagnosis not present

## 2016-09-07 DIAGNOSIS — R358 Other polyuria: Secondary | ICD-10-CM | POA: Diagnosis not present

## 2016-09-07 DIAGNOSIS — R8299 Other abnormal findings in urine: Secondary | ICD-10-CM | POA: Diagnosis not present

## 2016-09-07 DIAGNOSIS — R300 Dysuria: Secondary | ICD-10-CM | POA: Diagnosis not present

## 2016-11-04 DIAGNOSIS — H40053 Ocular hypertension, bilateral: Secondary | ICD-10-CM | POA: Diagnosis not present

## 2016-11-04 DIAGNOSIS — H40013 Open angle with borderline findings, low risk, bilateral: Secondary | ICD-10-CM | POA: Diagnosis not present

## 2017-01-09 DIAGNOSIS — M810 Age-related osteoporosis without current pathological fracture: Secondary | ICD-10-CM | POA: Diagnosis not present

## 2017-01-13 DIAGNOSIS — L821 Other seborrheic keratosis: Secondary | ICD-10-CM | POA: Diagnosis not present

## 2017-04-04 DIAGNOSIS — D1801 Hemangioma of skin and subcutaneous tissue: Secondary | ICD-10-CM | POA: Diagnosis not present

## 2017-04-04 DIAGNOSIS — L821 Other seborrheic keratosis: Secondary | ICD-10-CM | POA: Diagnosis not present

## 2017-04-04 DIAGNOSIS — Z85820 Personal history of malignant melanoma of skin: Secondary | ICD-10-CM | POA: Diagnosis not present

## 2017-04-04 DIAGNOSIS — L738 Other specified follicular disorders: Secondary | ICD-10-CM | POA: Diagnosis not present

## 2017-04-04 DIAGNOSIS — L814 Other melanin hyperpigmentation: Secondary | ICD-10-CM | POA: Diagnosis not present

## 2017-04-04 DIAGNOSIS — D224 Melanocytic nevi of scalp and neck: Secondary | ICD-10-CM | POA: Diagnosis not present

## 2017-04-04 DIAGNOSIS — L570 Actinic keratosis: Secondary | ICD-10-CM | POA: Diagnosis not present

## 2017-05-05 DIAGNOSIS — G4709 Other insomnia: Secondary | ICD-10-CM | POA: Diagnosis not present

## 2017-05-05 DIAGNOSIS — M810 Age-related osteoporosis without current pathological fracture: Secondary | ICD-10-CM | POA: Diagnosis not present

## 2017-05-05 DIAGNOSIS — Z0000 Encounter for general adult medical examination without abnormal findings: Secondary | ICD-10-CM | POA: Diagnosis not present

## 2017-05-12 DIAGNOSIS — M810 Age-related osteoporosis without current pathological fracture: Secondary | ICD-10-CM | POA: Diagnosis not present

## 2017-05-12 DIAGNOSIS — C4330 Malignant melanoma of unspecified part of face: Secondary | ICD-10-CM | POA: Diagnosis not present

## 2017-05-12 DIAGNOSIS — Z6825 Body mass index (BMI) 25.0-25.9, adult: Secondary | ICD-10-CM | POA: Diagnosis not present

## 2017-05-12 DIAGNOSIS — Z1389 Encounter for screening for other disorder: Secondary | ICD-10-CM | POA: Diagnosis not present

## 2017-05-12 DIAGNOSIS — Z0000 Encounter for general adult medical examination without abnormal findings: Secondary | ICD-10-CM | POA: Diagnosis not present

## 2017-05-12 DIAGNOSIS — Z87448 Personal history of other diseases of urinary system: Secondary | ICD-10-CM | POA: Diagnosis not present

## 2017-05-12 DIAGNOSIS — G4709 Other insomnia: Secondary | ICD-10-CM | POA: Diagnosis not present

## 2017-05-12 DIAGNOSIS — Z124 Encounter for screening for malignant neoplasm of cervix: Secondary | ICD-10-CM | POA: Diagnosis not present

## 2017-05-16 DIAGNOSIS — Z1212 Encounter for screening for malignant neoplasm of rectum: Secondary | ICD-10-CM | POA: Diagnosis not present

## 2017-05-25 DIAGNOSIS — H40013 Open angle with borderline findings, low risk, bilateral: Secondary | ICD-10-CM | POA: Diagnosis not present

## 2017-05-25 DIAGNOSIS — H40053 Ocular hypertension, bilateral: Secondary | ICD-10-CM | POA: Diagnosis not present

## 2017-06-09 DIAGNOSIS — Z1231 Encounter for screening mammogram for malignant neoplasm of breast: Secondary | ICD-10-CM | POA: Diagnosis not present

## 2017-08-14 DIAGNOSIS — Z23 Encounter for immunization: Secondary | ICD-10-CM | POA: Diagnosis not present

## 2017-12-05 DIAGNOSIS — H40053 Ocular hypertension, bilateral: Secondary | ICD-10-CM | POA: Diagnosis not present

## 2017-12-05 DIAGNOSIS — H40013 Open angle with borderline findings, low risk, bilateral: Secondary | ICD-10-CM | POA: Diagnosis not present

## 2018-05-11 DIAGNOSIS — R82998 Other abnormal findings in urine: Secondary | ICD-10-CM | POA: Diagnosis not present

## 2018-05-11 DIAGNOSIS — M810 Age-related osteoporosis without current pathological fracture: Secondary | ICD-10-CM | POA: Diagnosis not present

## 2018-05-11 DIAGNOSIS — Z0000 Encounter for general adult medical examination without abnormal findings: Secondary | ICD-10-CM | POA: Diagnosis not present

## 2018-05-18 DIAGNOSIS — Z6825 Body mass index (BMI) 25.0-25.9, adult: Secondary | ICD-10-CM | POA: Diagnosis not present

## 2018-05-18 DIAGNOSIS — E7849 Other hyperlipidemia: Secondary | ICD-10-CM | POA: Diagnosis not present

## 2018-05-18 DIAGNOSIS — Z0000 Encounter for general adult medical examination without abnormal findings: Secondary | ICD-10-CM | POA: Diagnosis not present

## 2018-05-18 DIAGNOSIS — E785 Hyperlipidemia, unspecified: Secondary | ICD-10-CM | POA: Insufficient documentation

## 2018-05-18 DIAGNOSIS — Z23 Encounter for immunization: Secondary | ICD-10-CM | POA: Diagnosis not present

## 2018-05-18 DIAGNOSIS — G4709 Other insomnia: Secondary | ICD-10-CM | POA: Diagnosis not present

## 2018-05-18 DIAGNOSIS — C4330 Malignant melanoma of unspecified part of face: Secondary | ICD-10-CM | POA: Diagnosis not present

## 2018-05-18 DIAGNOSIS — M810 Age-related osteoporosis without current pathological fracture: Secondary | ICD-10-CM | POA: Diagnosis not present

## 2018-05-18 DIAGNOSIS — Z1389 Encounter for screening for other disorder: Secondary | ICD-10-CM | POA: Diagnosis not present

## 2018-05-23 ENCOUNTER — Other Ambulatory Visit: Payer: Self-pay | Admitting: Internal Medicine

## 2018-05-23 DIAGNOSIS — E785 Hyperlipidemia, unspecified: Secondary | ICD-10-CM

## 2018-05-25 DIAGNOSIS — Z1212 Encounter for screening for malignant neoplasm of rectum: Secondary | ICD-10-CM | POA: Diagnosis not present

## 2018-06-12 DIAGNOSIS — Z1231 Encounter for screening mammogram for malignant neoplasm of breast: Secondary | ICD-10-CM | POA: Diagnosis not present

## 2018-06-13 ENCOUNTER — Ambulatory Visit
Admission: RE | Admit: 2018-06-13 | Discharge: 2018-06-13 | Disposition: A | Discharge status: Home / Self Care | Payer: No Typology Code available for payment source | Source: Ambulatory Visit | Attending: Internal Medicine | Admitting: Internal Medicine

## 2018-06-13 DIAGNOSIS — E785 Hyperlipidemia, unspecified: Secondary | ICD-10-CM

## 2018-06-25 DIAGNOSIS — H40013 Open angle with borderline findings, low risk, bilateral: Secondary | ICD-10-CM | POA: Diagnosis not present

## 2018-06-27 DIAGNOSIS — L821 Other seborrheic keratosis: Secondary | ICD-10-CM | POA: Diagnosis not present

## 2018-06-27 DIAGNOSIS — Z85820 Personal history of malignant melanoma of skin: Secondary | ICD-10-CM | POA: Diagnosis not present

## 2018-06-27 DIAGNOSIS — D224 Melanocytic nevi of scalp and neck: Secondary | ICD-10-CM | POA: Diagnosis not present

## 2018-06-27 DIAGNOSIS — D1801 Hemangioma of skin and subcutaneous tissue: Secondary | ICD-10-CM | POA: Diagnosis not present

## 2018-06-27 DIAGNOSIS — L814 Other melanin hyperpigmentation: Secondary | ICD-10-CM | POA: Diagnosis not present

## 2018-06-27 DIAGNOSIS — L738 Other specified follicular disorders: Secondary | ICD-10-CM | POA: Diagnosis not present

## 2019-05-14 DIAGNOSIS — M810 Age-related osteoporosis without current pathological fracture: Secondary | ICD-10-CM | POA: Diagnosis not present

## 2019-05-14 DIAGNOSIS — E7849 Other hyperlipidemia: Secondary | ICD-10-CM | POA: Diagnosis not present

## 2019-05-16 DIAGNOSIS — R82998 Other abnormal findings in urine: Secondary | ICD-10-CM | POA: Diagnosis not present

## 2019-05-23 DIAGNOSIS — M810 Age-related osteoporosis without current pathological fracture: Secondary | ICD-10-CM | POA: Diagnosis not present

## 2019-05-23 DIAGNOSIS — Z0000 Encounter for general adult medical examination without abnormal findings: Secondary | ICD-10-CM | POA: Diagnosis not present

## 2019-05-23 DIAGNOSIS — Z1339 Encounter for screening examination for other mental health and behavioral disorders: Secondary | ICD-10-CM | POA: Diagnosis not present

## 2019-05-23 DIAGNOSIS — Z87448 Personal history of other diseases of urinary system: Secondary | ICD-10-CM | POA: Diagnosis not present

## 2019-05-23 DIAGNOSIS — G4700 Insomnia, unspecified: Secondary | ICD-10-CM | POA: Diagnosis not present

## 2019-05-23 DIAGNOSIS — E785 Hyperlipidemia, unspecified: Secondary | ICD-10-CM | POA: Diagnosis not present

## 2019-05-23 DIAGNOSIS — Z1331 Encounter for screening for depression: Secondary | ICD-10-CM | POA: Diagnosis not present

## 2019-05-23 DIAGNOSIS — C4330 Malignant melanoma of unspecified part of face: Secondary | ICD-10-CM | POA: Diagnosis not present

## 2019-06-18 DIAGNOSIS — Z1231 Encounter for screening mammogram for malignant neoplasm of breast: Secondary | ICD-10-CM | POA: Diagnosis not present

## 2019-07-02 DIAGNOSIS — Z85820 Personal history of malignant melanoma of skin: Secondary | ICD-10-CM | POA: Diagnosis not present

## 2019-07-02 DIAGNOSIS — D224 Melanocytic nevi of scalp and neck: Secondary | ICD-10-CM | POA: Diagnosis not present

## 2019-07-02 DIAGNOSIS — L814 Other melanin hyperpigmentation: Secondary | ICD-10-CM | POA: Diagnosis not present

## 2019-07-02 DIAGNOSIS — L821 Other seborrheic keratosis: Secondary | ICD-10-CM | POA: Diagnosis not present

## 2019-07-02 DIAGNOSIS — D1801 Hemangioma of skin and subcutaneous tissue: Secondary | ICD-10-CM | POA: Diagnosis not present

## 2019-07-02 DIAGNOSIS — D3611 Benign neoplasm of peripheral nerves and autonomic nervous system of face, head, and neck: Secondary | ICD-10-CM | POA: Diagnosis not present

## 2019-11-01 ENCOUNTER — Ambulatory Visit: Payer: Medicare Other | Attending: Internal Medicine

## 2019-11-01 DIAGNOSIS — Z23 Encounter for immunization: Secondary | ICD-10-CM | POA: Insufficient documentation

## 2019-11-01 NOTE — Progress Notes (Signed)
   Covid-19 Vaccination Clinic  Name:  Ashley Hines    MRN: [id number] DOB: [birth year]  11/01/2019  [name] was observed post Covid-19 immunization for 15 minutes without incidence. She was provided with Vaccine Information Sheet and instruction to access the V-Safe system.   [name] was instructed to call 911 with any severe reactions post vaccine: Marland Kitchen Difficulty breathing  . Swelling of your face and throat  . A fast heartbeat  . A bad rash all over your body  . Dizziness and weakness    Immunizations Administered    Name Date Dose VIS Date Route   Pfizer COVID-19 Vaccine 11/01/2019  9:57 AM 0.3 mL 09/20/2019 Intramuscular   Manufacturer: [place]   Lot: [id number]   [place]: [id number]

## 2019-11-21 ENCOUNTER — Ambulatory Visit: Payer: Medicare Other | Attending: Internal Medicine

## 2019-11-21 DIAGNOSIS — Z23 Encounter for immunization: Secondary | ICD-10-CM

## 2019-11-21 NOTE — Progress Notes (Signed)
   Covid-19 Vaccination Clinic  Name:  Ashley Hines    MRN: [id number] DOB: April 28, 1946  11/21/2019  [name] was observed post Covid-19 immunization for 15 minutes without incidence. She was provided with Vaccine Information Sheet and instruction to access the V-Safe system.   [name] was instructed to call 911 with any severe reactions post vaccine: Marland Kitchen Difficulty breathing  . Swelling of your face and throat  . A fast heartbeat  . A bad rash all over your body  . Dizziness and weakness    Immunizations Administered    Name Date Dose VIS Date Route   Pfizer COVID-19 Vaccine 11/21/2019  3:30 PM 0.3 mL 09/20/2019 Intramuscular   Manufacturer: [place]   Lot: [id number]   [place]: [id number]

## 2019-11-29 DIAGNOSIS — H40013 Open angle with borderline findings, low risk, bilateral: Secondary | ICD-10-CM | POA: Diagnosis not present

## 2019-12-23 DIAGNOSIS — H40013 Open angle with borderline findings, low risk, bilateral: Secondary | ICD-10-CM | POA: Diagnosis not present

## 2019-12-23 DIAGNOSIS — H40053 Ocular hypertension, bilateral: Secondary | ICD-10-CM | POA: Diagnosis not present

## 2020-05-12 ENCOUNTER — Ambulatory Visit (INDEPENDENT_AMBULATORY_CARE_PROVIDER_SITE_OTHER): Payer: Medicare Other

## 2020-05-12 ENCOUNTER — Ambulatory Visit (INDEPENDENT_AMBULATORY_CARE_PROVIDER_SITE_OTHER): Payer: Medicare Other | Admitting: Family Medicine

## 2020-05-12 ENCOUNTER — Encounter: Payer: Self-pay | Admitting: Family Medicine

## 2020-05-12 ENCOUNTER — Other Ambulatory Visit: Payer: Self-pay

## 2020-05-12 DIAGNOSIS — M25511 Pain in right shoulder: Secondary | ICD-10-CM | POA: Diagnosis not present

## 2020-05-12 MED ORDER — TIZANIDINE HCL 2 MG PO TABS: 2.0000 mg | ORAL_TABLET | Freq: Four times a day (QID) | ORAL | 1 refills | Status: DC | PRN

## 2020-05-12 MED ORDER — MELOXICAM 15 MG PO TABS: 7.5000 mg | ORAL_TABLET | Freq: Every day | ORAL | 6 refills | Status: DC | PRN

## 2020-05-12 NOTE — Progress Notes (Signed)
   Office Visit Note   Patient: Ashley Hines           Date of Birth: [birth year]           MRN: [id number] Visit Date: 05/12/2020 Requested by: Shon Baton, [place] [place],  [place] [id number] PCP: Shon Baton, MD  Subjective: Chief Complaint  Patient presents with  . Right Shoulder - Pain    Has had occasional soreness over last few weeks to maybe a month. Worsened over this past weekend. Plays a lot of tennis, but has not played recently. Pain is on top of the shoulder, with occasional tingling. Throbbing pain.    HPI: She is here with right shoulder pain.  She is a Firefighter.  No injury, she noticed pain for the past month or so.  Pain mostly in the trapezius area with occasional tingling in that area, not down her arm.  She is not taking anything for her pain.               ROS: No rash.  All other systems were reviewed and are negative.  Objective: Vital Signs: There were no vitals taken for this visit.  Physical Exam:  General:  Alert and oriented, in no acute distress. Pulm:  Breathing unlabored. Psy:  Normal mood, congruent affect.  Right shoulder: Neck range of motion is full, Spurling's test is negative.  She has tenderness along the trapezius belly, no tenderness at the [facility] joint.  Full range of motion of the shoulder with no adhesive capsulitis.  Rotator cuff strength is 5/5 throughout.  Biceps, triceps, wrist and intrinsic hand strength are normal.  Negative AC crossover test.   Imaging: XR Shoulder Right  Result Date: 05/12/2020 X-rays right shoulder reveal moderate narrowing of the [facility] joint.  Glenohumeral joint looks normal, there is soft tissue calcifications.  Normal alignment.   Assessment & Plan: 1.  Right shoulder pain, etiology uncertain.  Could be myofascial pain, cannot rule out cervical disc protrusion. -We will try meloxicam, physical therapy.  If she fails to improve, then cervical x-rays and possibly MRI scan.     Procedures: No  procedures performed  No notes on file     PMFS History: There are no problems to display for this patient.  Past Medical History:  Diagnosis Date  . Melanoma ([place]) 2009   face  . Osteopenia     Family History  Problem Relation Age of Onset  . Colon cancer Neg Hx   . Stomach cancer Neg Hx     Past Surgical History:  Procedure Laterality Date  . [place]  . COLONOSCOPY    . MELANOMA EXCISION  2009   face   Social History   Occupational History  . Not on file  Tobacco Use  . Smoking status: Never Smoker  . Smokeless tobacco: Never Used  Substance and Sexual Activity  . Alcohol use: Yes    Alcohol/week: 7.0 standard drinks    Types: 7 Glasses of wine per week  . Drug use: No  . Sexual activity: Not on file

## 2020-05-14 ENCOUNTER — Ambulatory Visit (INDEPENDENT_AMBULATORY_CARE_PROVIDER_SITE_OTHER): Payer: Medicare Other | Admitting: Rehabilitative and Restorative Service Providers"

## 2020-05-14 ENCOUNTER — Encounter: Payer: Self-pay | Admitting: Rehabilitative and Restorative Service Providers"

## 2020-05-14 DIAGNOSIS — M25511 Pain in right shoulder: Secondary | ICD-10-CM

## 2020-05-14 DIAGNOSIS — R293 Abnormal posture: Secondary | ICD-10-CM | POA: Diagnosis not present

## 2020-05-14 DIAGNOSIS — M542 Cervicalgia: Secondary | ICD-10-CM

## 2020-05-14 NOTE — Patient Instructions (Signed)
Access Code: [email or web address] URL: [email or web address] Date: 05/14/2020 Prepared by: Scot Jun  Exercises Seated Gentle Upper Trapezius Stretch - 2 x daily - 7 x weekly - 1 sets - 5 reps - 15 hold Shoulder External Rotation and Scapular Retraction with Resistance - 2 x daily - 7 x weekly - 2 sets - 10 reps Seated Thoracic Lumbar Extension - 2 x daily - 7 x weekly - 2 sets - 10 reps

## 2020-05-14 NOTE — Therapy (Signed)
[place], [place], [address] Phone: [phone]   Fax:  [phone]  Physical Therapy Evaluation  Patient Details  Name: Ashley Hines MRN: [id number] Date of Birth: July 05, 1946 Referring Provider (PT): Dr. Junius Roads   Encounter Date: 05/14/2020   PT End of Session - 05/14/20 1251    Visit Number 1    Number of Visits 12    Date for PT Re-Evaluation 07/09/20    Authorization Type Medicare    Authorization Time Period 05/14/2020-07/09/2020    Progress Note Due on Visit 10    PT Start Time 1256    PT Stop Time 1333    PT Time Calculation (min) 37 min    Activity Tolerance Patient tolerated treatment well    Behavior During Therapy [facility] for tasks assessed/performed           Past Medical History:  Diagnosis Date  . Melanoma ([place]) 2009   face  . Osteopenia     Past Surgical History:  Procedure Laterality Date  . [place]  . COLONOSCOPY    . MELANOMA EXCISION  2009   face    There were no vitals filed for this visit.    Subjective Assessment - 05/14/20 1255    Subjective Pt. indicated pain primarily located in superior Rt shoulder and sometimes tingling at times.  Pt. indicated off and on soreness for a while but recent increase noted in last week.  Rested from tennis but not better.    Diagnostic tests xray shoulder    Patient Stated Goals Reduce pain    Currently in Pain? Yes    Pain Score 5    at worst 9/10   Pain Location Shoulder    Pain Orientation Right    Pain Descriptors / Indicators Aching;Throbbing    Pain Type Acute pain    Pain Radiating Towards Denied UE symptoms into arm    Pain Onset 1 to 4 weeks ago    Pain Frequency Constant    Aggravating Factors  Maybe playing tennis, quick movements at times.  Difficulty sleeping but able to change positions to return to sleep    Pain Relieving Factors Advil, ice, rest    Effect of Pain on Daily Activities Playing tennis.                [facility] PT Assessment - 05/14/20 0001      Assessment   Medical Diagnosis Rt shoulder pain    Referring Provider (PT) Dr. Junius Roads    Onset Date/Surgical Date 04/09/20    Hand Dominance Right      Precautions   Precautions None      Balance Screen   Has the patient fallen in the past 6 months No    Has the patient had a decrease in activity level because of a fear of falling?  Yes   2/2 symptoms   Is the patient reluctant to leave their home because of a fear of falling?  No      Home Environment   Living Environment Private residence    Home Layout Two level    Alternate Level Stairs-Number of Steps flight of stairs      Prior Function   Leisure Tennis Player recreational      Cognition   Overall Cognitive Status Within Functional Limits for tasks assessed      Posture/Postural Control   Posture/Postural Control Postural limitations    Postural Limitations Rounded Shoulders;Forward head  ROM / Strength   AROM / PROM / Strength AROM;PROM;Strength      AROM   Overall AROM Comments WFL shoulder AROM bilateral without symptoms    AROM Assessment Site Cervical;Shoulder    Right/Left Shoulder Left;Right    Cervical Flexion 60    Cervical Extension 36    Cervical - Right Rotation 65    Cervical - Left Rotation 65      Strength   Strength Assessment Site Shoulder;Elbow    Right/Left Shoulder Left;Right    Right Shoulder Flexion 5/5    Right Shoulder ABduction 5/5    Right Shoulder Internal Rotation 5/5    Right Shoulder External Rotation 5/5    Left Shoulder Flexion 5/5    Left Shoulder ABduction 5/5    Left Shoulder Internal Rotation 5/5    Left Shoulder External Rotation 5/5    Right/Left Elbow Left;Right    Right Elbow Flexion 5/5    Right Elbow Extension 5/5    Left Elbow Flexion 5/5    Left Elbow Extension 5/5      Palpation   Palpation comment Concordant symptoms TrP Rt upper trap, levator                      Objective  measurements completed on examination: See above findings.       [facility] Adult PT Treatment/Exercise - 05/14/20 0001      Exercises   Exercises Other Exercises    Other Exercises  HEP instruction/performance c cues required.  HEP consisting of Rt upper trap stretch 15 sec x 5, seated er/scap retraction 2 x 10, thoracic extension in chair 2 x 10      Manual Therapy   Manual therapy comments compression to Rt upper trap, skilled palpation for tissue response             Trigger Point Dry Needling - 05/14/20 0001    Consent Given? Yes    Education Handout Provided No   next visit   Muscles Treated Head and Neck Upper trapezius   Rt   Upper Trapezius Response Twitch reponse elicited                PT Education - 05/14/20 1255    Education Details HEP, POC    Person(s) Educated Patient    Methods Explanation;Demonstration;Verbal cues;Handout    Comprehension Verbalized understanding;Returned demonstration            PT Short Term Goals - 05/14/20 1254      PT SHORT TERM GOAL #1   Title Patient will demonstrate independent use of home exercise program to maintain progress from in clinic treatments.    Time 2    Period Weeks    Status New    Target Date 05/28/20             PT Long Term Goals - 05/14/20 1254      PT LONG TERM GOAL #1   Title Patient will demonstrate/report pain at worst less than or equal to 2/10 to facilitate minimal limitation in daily activity secondary to pain symptoms.    Time 8    Period Weeks    Status New    Target Date 07/09/20      PT LONG TERM GOAL #2   Title Patient will demonstrate independent use of home exercise program to facilitate ability to maintain/progress functional gains from skilled physical therapy services.    Time 8    Period Weeks  Status New    Target Date 07/09/20      PT LONG TERM GOAL #3   Title Patient will demonstrate return to work/recreational activity at previous level of function without  limitations secondary due to condition    Time 8    Period Weeks    Status New    Target Date 07/09/20      PT LONG TERM GOAL #4   Title Pt. will demonstrate cervical AROM WFL s symptoms and limitation for normal mobility at PLOF    Time 8    Period Weeks    Status New    Target Date 07/09/20                  Plan - 05/14/20 1250    Clinical Impression Statement Patient is a 74 y.o. female who comes to clinic with complaints of Rt shoulder pain with mobility, strength and movement coordination deficits that impair her ability to perform usual daily and recreational functional activities without increase difficulty/symptoms at this time.  Patient to benefit from skilled PT services to address impairments and limitations to improve to previous level of function without restriction secondary to condition.    Examination-Activity Limitations Sleep    Examination-Participation Restrictions Other   tennis   Stability/Clinical Decision Making Stable/Uncomplicated    Clinical Decision Making Low    Rehab Potential Good    PT Frequency --   1-2x/week   PT Duration 8 weeks    PT Treatment/Interventions ADLs/Self Care Home Management;Electrical Stimulation;Iontophoresis 4mg /ml Dexamethasone;Moist Heat;Traction;Balance training;Therapeutic exercise;Therapeutic activities;Functional mobility training;Stair training;Gait training;Patient/family education;Ultrasound;Neuromuscular re-education;Manual techniques;Taping;Dry needling;Passive range of motion;Spinal Manipulations;Joint Manipulations    PT Next Visit Plan Review HEP, possible DN?    PT Home Exercise Plan [email or web address]    Consulted and Agree with Plan of Care Patient           Patient will benefit from skilled therapeutic intervention in order to improve the following deficits and impairments:  Pain, Impaired UE functional use, Impaired perceived functional ability, Decreased range of motion, Decreased activity tolerance, Increased  muscle spasms, Hypomobility  Visit Diagnosis: Acute pain of right shoulder  Cervicalgia  Abnormal posture     Problem List There are no problems to display for this patient.   Scot Jun, PT, DPT, OCS, ATC 05/14/20  1:40 PM    [facility] Physical Therapy [address] [place], [place], [address] Phone: [phone]   Fax:  [phone]  Name: Ashley Hines MRN: [id number] Date of Birth: [birth year]

## 2020-05-19 DIAGNOSIS — E7849 Other hyperlipidemia: Secondary | ICD-10-CM | POA: Diagnosis not present

## 2020-05-19 DIAGNOSIS — E785 Hyperlipidemia, unspecified: Secondary | ICD-10-CM | POA: Diagnosis not present

## 2020-05-19 DIAGNOSIS — M810 Age-related osteoporosis without current pathological fracture: Secondary | ICD-10-CM | POA: Diagnosis not present

## 2020-05-21 ENCOUNTER — Ambulatory Visit (INDEPENDENT_AMBULATORY_CARE_PROVIDER_SITE_OTHER): Payer: Medicare Other | Admitting: Rehabilitative and Restorative Service Providers"

## 2020-05-21 ENCOUNTER — Encounter: Payer: Self-pay | Admitting: Rehabilitative and Restorative Service Providers"

## 2020-05-21 ENCOUNTER — Other Ambulatory Visit: Payer: Self-pay

## 2020-05-21 DIAGNOSIS — M25511 Pain in right shoulder: Secondary | ICD-10-CM | POA: Diagnosis not present

## 2020-05-21 DIAGNOSIS — R293 Abnormal posture: Secondary | ICD-10-CM

## 2020-05-21 DIAGNOSIS — M542 Cervicalgia: Secondary | ICD-10-CM

## 2020-05-21 NOTE — Therapy (Signed)
[place], [place], [address] Phone: [phone]   Fax:  [phone]  Physical Therapy Treatment  Patient Details  Name: Ashley Hines MRN: [id number] Date of Birth: 10/21/1945 Referring Provider (PT): Dr. Junius Roads   Encounter Date: 05/21/2020   PT End of Session - 05/21/20 1056    Visit Number 2    Number of Visits 12    Date for PT Re-Evaluation 07/09/20    Authorization Type Medicare    Authorization Time Period 05/14/2020-07/09/2020    Progress Note Due on Visit 10    PT Start Time 1057    PT Stop Time 1136    PT Time Calculation (min) 39 min    Activity Tolerance Patient tolerated treatment well    Behavior During Therapy [facility] for tasks assessed/performed           Past Medical History:  Diagnosis Date  . Melanoma ([place]) 2009   face  . Osteopenia     Past Surgical History:  Procedure Laterality Date  . [place]  . COLONOSCOPY    . MELANOMA EXCISION  2009   face    There were no vitals filed for this visit.   Subjective Assessment - 05/21/20 1100    Subjective Pt. stated having improvement and no pain for medicine today.  Was sore afterward.    Diagnostic tests xray shoulder    Patient Stated Goals Reduce pain    Currently in Pain? No/denies    Pain Score 0-No pain    Pain Orientation Right    Pain Onset 1 to 4 weeks ago                             [facility] Adult PT Treatment/Exercise - 05/21/20 0001      Self-Care   Self-Care Other Self-Care Comments (P)       Exercises   Exercises Neck      Neck Exercises: Machines for Strengthening   UBE (Upper Arm Bike) Lvl 2 3 mins fwd/back each way      Neck Exercises: Theraband   Shoulder Extension Green   3 x 10   Rows Green   3 x 10   Shoulder External Rotation Green   3 x 10 bilateral     Modalities   Modalities Moist Heat      Moist Heat Therapy   Number Minutes Moist Heat 5 Minutes   c exercise  stretching   Moist Heat Location Cervical   Rt     Manual Therapy   Manual therapy comments compression to Rt upper trap, skilled palpation for tissue response       Neck Exercises: Stretches   Upper Trapezius Stretch 5 reps;Other (comment)   15 sec hold   Other Neck Stretches seated scap retraction c bilateral UE 2 x 10            Trigger Point Dry Needling - 05/21/20 0001    Consent Given? Yes    Education Handout Provided Yes    Muscles Treated Head and Neck Upper trapezius    Upper Trapezius Response Twitch reponse elicited                  PT Short Term Goals - 05/14/20 1254      PT SHORT TERM GOAL #1   Title Patient will demonstrate independent use of home exercise program to maintain progress from in  clinic treatments.    Time 2    Period Weeks    Status New    Target Date 05/28/20             PT Long Term Goals - 05/14/20 1254      PT LONG TERM GOAL #1   Title Patient will demonstrate/report pain at worst less than or equal to 2/10 to facilitate minimal limitation in daily activity secondary to pain symptoms.    Time 8    Period Weeks    Status New    Target Date 07/09/20      PT LONG TERM GOAL #2   Title Patient will demonstrate independent use of home exercise program to facilitate ability to maintain/progress functional gains from skilled physical therapy services.    Time 8    Period Weeks    Status New    Target Date 07/09/20      PT LONG TERM GOAL #3   Title Patient will demonstrate return to work/recreational activity at previous level of function without limitations secondary due to condition    Time 8    Period Weeks    Status New    Target Date 07/09/20      PT LONG TERM GOAL #4   Title Pt. will demonstrate cervical AROM WFL s symptoms and limitation for normal mobility at PLOF    Time 8    Period Weeks    Status New    Target Date 07/09/20                 Plan - 05/21/20 1113    Clinical Impression Statement  Positive initial change in symptoms noted c marked reduction in pain overall.  Pt. has not played tennis since last visit.  Continued skilled PT services to address symptoms and continue progression towards PLOF indicated.    Examination-Activity Limitations Sleep    Examination-Participation Restrictions Other   tennis   Stability/Clinical Decision Making Stable/Uncomplicated    Rehab Potential Good    PT Frequency --   1-2x/week   PT Duration 8 weeks    PT Treatment/Interventions ADLs/Self Care Home Management;Electrical Stimulation;Iontophoresis 4mg /ml Dexamethasone;Moist Heat;Traction;Balance training;Therapeutic exercise;Therapeutic activities;Functional mobility training;Stair training;Gait training;Patient/family education;Ultrasound;Neuromuscular re-education;Manual techniques;Taping;Dry needling;Passive range of motion;Spinal Manipulations;Joint Manipulations    PT Next Visit Plan DN prn, continue return to recreational activity s limitation.    PT Home Exercise Plan [email or web address]    Consulted and Agree with Plan of Care Patient           Patient will benefit from skilled therapeutic intervention in order to improve the following deficits and impairments:  Pain, Impaired UE functional use, Impaired perceived functional ability, Decreased range of motion, Decreased activity tolerance, Increased muscle spasms, Hypomobility  Visit Diagnosis: Acute pain of right shoulder  Cervicalgia  Abnormal posture     Problem List There are no problems to display for this patient.  Scot Jun, PT, DPT, OCS, ATC 05/21/20  11:28 AM    [facility] Physical Therapy [address] [place], [place], [address] Phone: [phone]   Fax:  [phone]  Name: Ashley Hines MRN: [id number] Date of Birth: 1946/03/17

## 2020-05-23 IMAGING — CT CT HEART SCORING
3 series · 14 of 20 positions shown, 16 images · non-contrast
Comparison: None.

CLINICAL DATA: 72-year-old with borderline hyperlipidemia.

EXAM:
CT HEART FOR CALCIUM SCORING
TECHNIQUE: CT heart was performed on a 64 channel system using prospective ECG
gating.
A non-contrast exam for calcium scoring was performed.
Note that this exam targets the heart and the chest was not imaged
in its entirety.

[Series 4: calcium scoring 2.00 qr36 bestdiast 71% · axial · 0.32mm/px · z∈[+1631,+1703]mm · 4 of 62 slices shown]
[im 13/62  vessel]
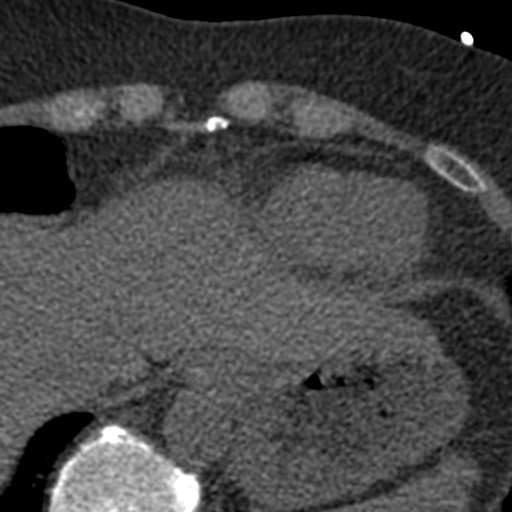
[im 25/62  vessel]
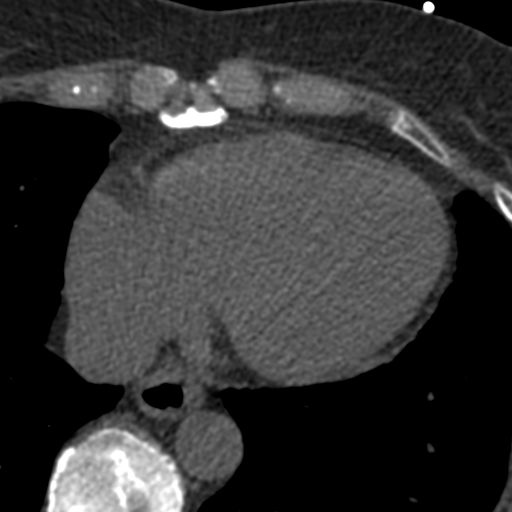
[im 37/62  vessel]
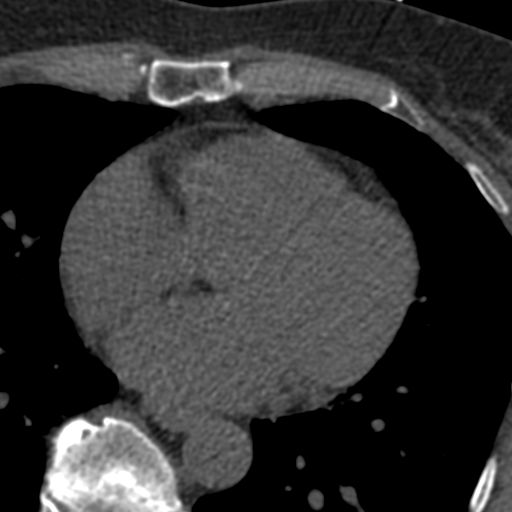
[im 49/62  vessel]
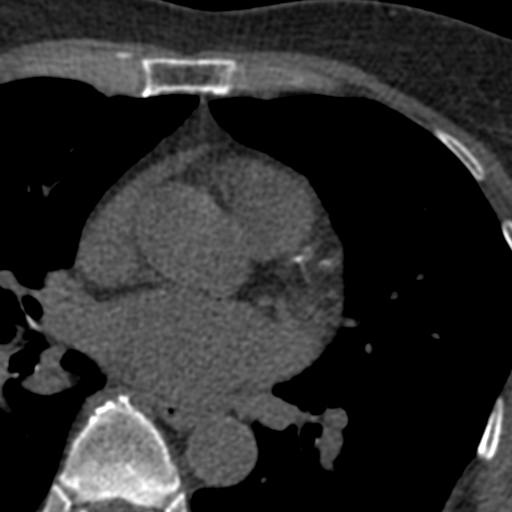

[Series 15: calcium scoring 2.00 br40 bestdiast 71% ax fov · axial · 0.41mm/px · z∈[+1627,+1721]mm · 5 of 71 slices shown, 7 images]
[im 12/71  vessel]
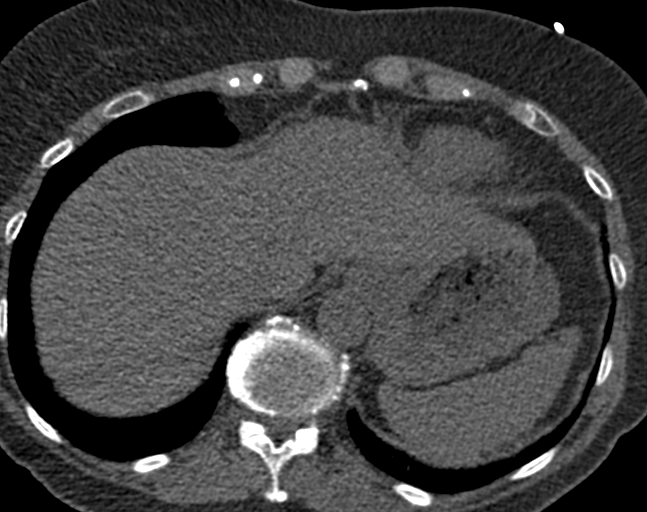
[im 12/71  lung]
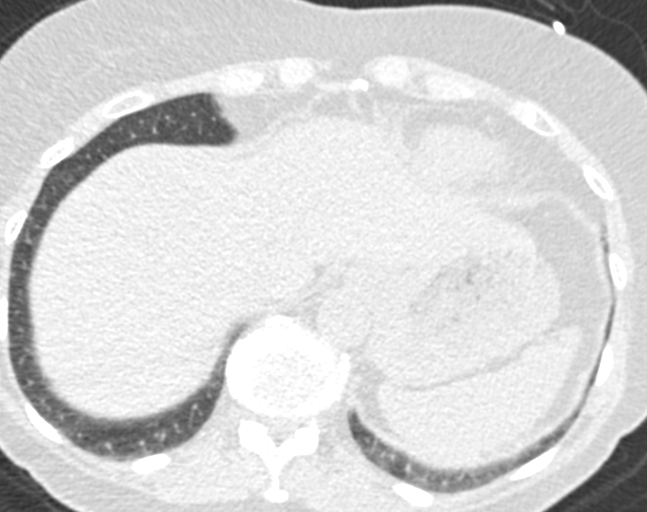
[im 24/71  vessel]
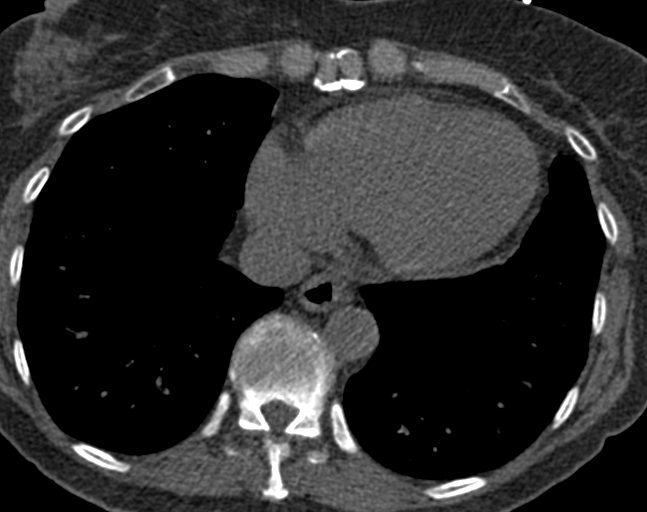
[im 36/71  vessel]
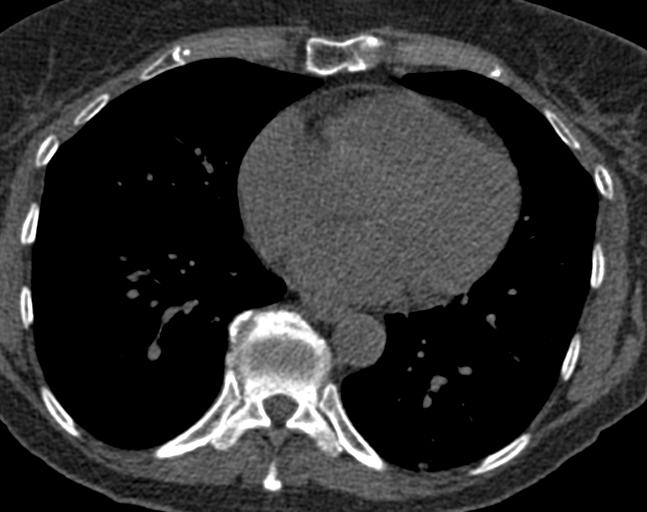
[im 47/71  vessel]
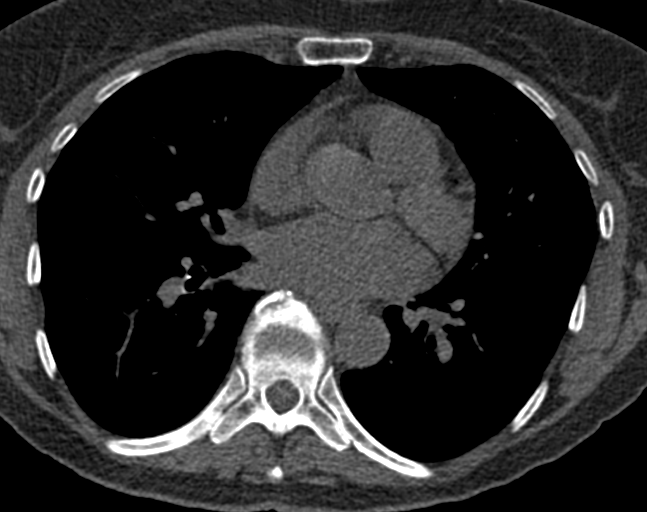
[im 59/71  vessel]
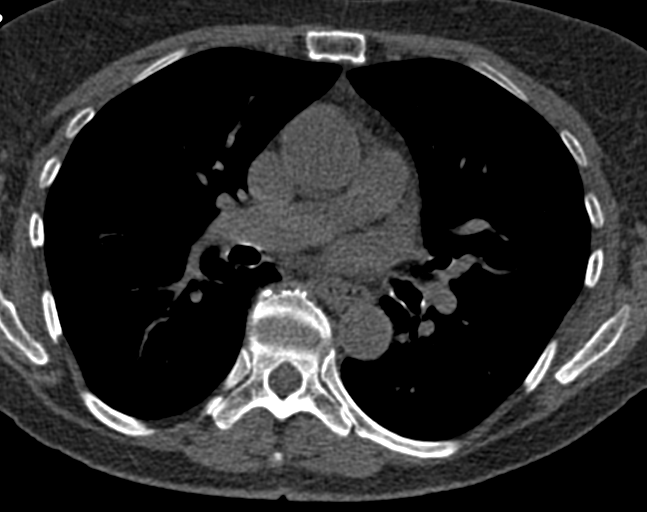
[im 59/71  lung]
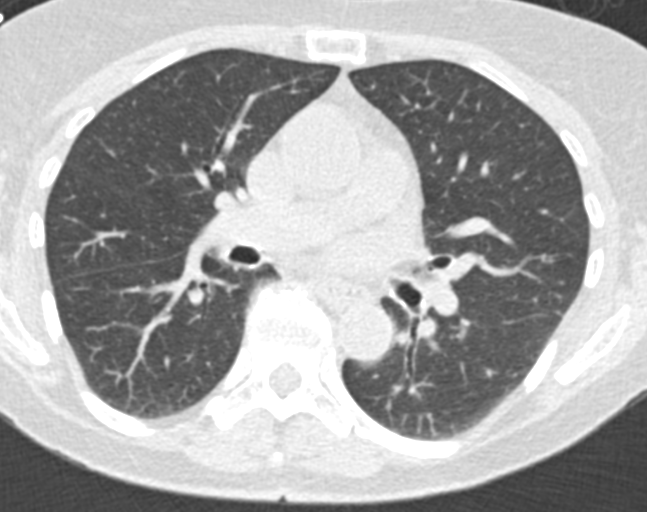

[Series 22: calcium scoring 2.00 br60 bestdiast 71% ax fov · axial · 0.44mm/px · z∈[+1633,+1721]mm · 5 of 68 slices shown]
[im 12/68  vessel]
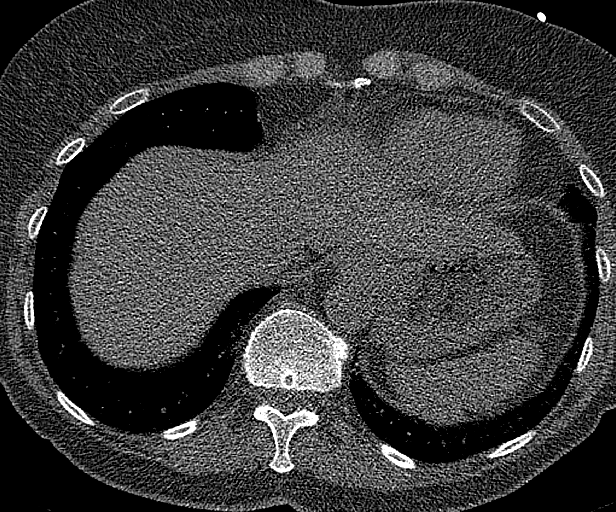
[im 23/68  vessel]
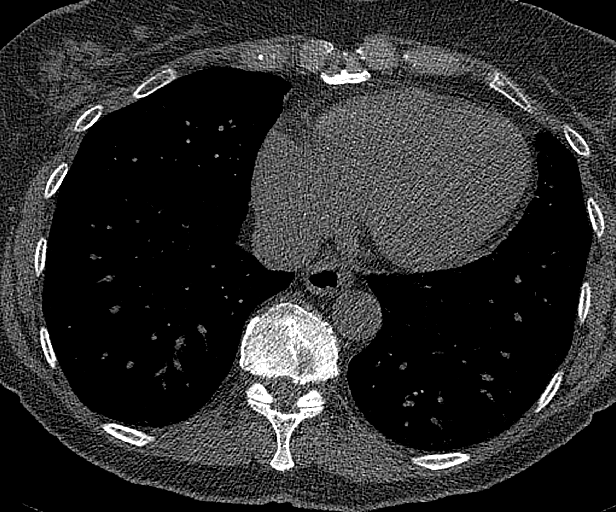
[im 34/68  vessel]
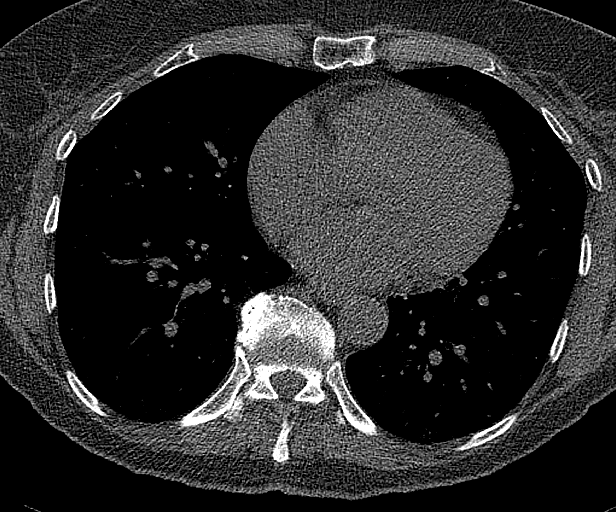
[im 45/68  vessel]
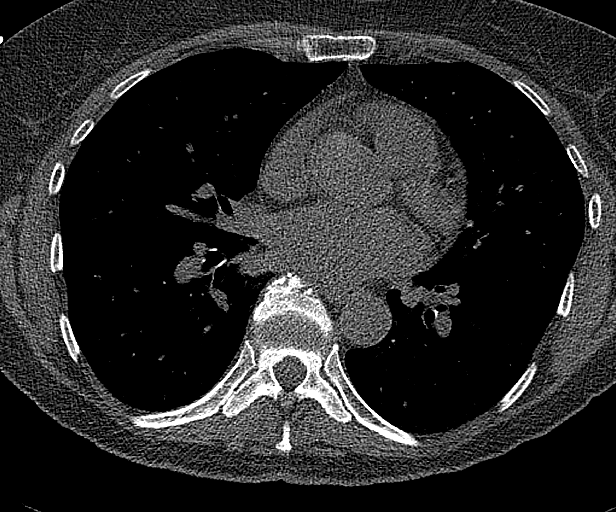
[im 56/68  vessel]
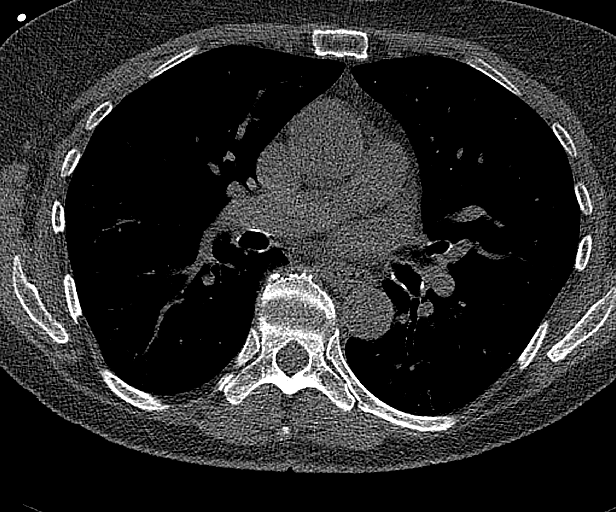

[14 of 20 positions shown; findings below may reference images not displayed]

FINDINGS: Technical quality: Good.

CORONARY CALCIUM

Total Agatston Score: 28.0 (the coronary calcium is present in the
LAD and in a diagonal branch)

[HOSPITAL] percentile:  54th

OTHER FINDINGS:

Cardiovascular: Mild atherosclerosis involving the junction of the
distal thoracic and proximal abdominal aorta. Mild aortic annular
calcification. No evidence of aortic aneurysm.

Mediastinum/Nodes: No pathologic lymphadenopathy within the
visualized mediastinum. Visualized esophagus normal in appearance.

Lungs/Pleura: Visualized lung parenchyma clear. Central bronchi
patent without significant bronchial wall thickening. No pleural
effusions.

Upper Abdomen: Unremarkable for the unenhanced technique.

Musculoskeletal: Mild spondylosis involving the visualized lower
thoracic spine. No acute findings.
IMPRESSION: 1. Total coronary calcium Agatston score of 28.0, placing the
patient in the 54th percentile for age and race according to the
[HOSPITAL]. The coronary calcium localizes to the LAD and a
diagonal branch off the LAD.
2.  Aortic Atherosclerosis, mild.  (ALOKO-170.0)
3. No significant extracardiac findings otherwise.

## 2020-05-26 DIAGNOSIS — R82998 Other abnormal findings in urine: Secondary | ICD-10-CM | POA: Diagnosis not present

## 2020-05-26 DIAGNOSIS — M810 Age-related osteoporosis without current pathological fracture: Secondary | ICD-10-CM | POA: Diagnosis not present

## 2020-05-26 DIAGNOSIS — E785 Hyperlipidemia, unspecified: Secondary | ICD-10-CM | POA: Diagnosis not present

## 2020-05-26 DIAGNOSIS — C4330 Malignant melanoma of unspecified part of face: Secondary | ICD-10-CM | POA: Diagnosis not present

## 2020-05-26 DIAGNOSIS — G4700 Insomnia, unspecified: Secondary | ICD-10-CM | POA: Diagnosis not present

## 2020-05-26 DIAGNOSIS — Z0000 Encounter for general adult medical examination without abnormal findings: Secondary | ICD-10-CM | POA: Diagnosis not present

## 2020-05-26 DIAGNOSIS — M25511 Pain in right shoulder: Secondary | ICD-10-CM | POA: Diagnosis not present

## 2020-05-28 ENCOUNTER — Ambulatory Visit (INDEPENDENT_AMBULATORY_CARE_PROVIDER_SITE_OTHER): Payer: Medicare Other | Admitting: Rehabilitative and Restorative Service Providers"

## 2020-05-28 ENCOUNTER — Other Ambulatory Visit: Payer: Self-pay

## 2020-05-28 DIAGNOSIS — M542 Cervicalgia: Secondary | ICD-10-CM | POA: Diagnosis not present

## 2020-05-28 DIAGNOSIS — R293 Abnormal posture: Secondary | ICD-10-CM

## 2020-05-28 DIAGNOSIS — M25511 Pain in right shoulder: Secondary | ICD-10-CM | POA: Diagnosis not present

## 2020-05-28 NOTE — Therapy (Addendum)
[place], [place], [address] Phone: [phone]   Fax:  [phone]  Physical Therapy Treatment/Discharge  Patient Details  Name: Ashley Hines MRN: [id number] Date of Birth: 1945-11-12 Referring Provider (PT): Dr. Junius Roads   Encounter Date: 05/28/2020   PT End of Session - 05/28/20 0843    Visit Number 3    Number of Visits 12    Date for PT Re-Evaluation 07/09/20    Authorization Type Medicare    Authorization Time Period 05/14/2020-07/09/2020    Progress Note Due on Visit 10    PT Start Time 0844    PT Stop Time 0922    PT Time Calculation (min) 38 min    Activity Tolerance Patient tolerated treatment well    Behavior During Therapy [facility] for tasks assessed/performed           Past Medical History:  Diagnosis Date  . Melanoma ([place]) 2009   face  . Osteopenia     Past Surgical History:  Procedure Laterality Date  . [place]  . COLONOSCOPY    . MELANOMA EXCISION  2009   face    There were no vitals filed for this visit.   Subjective Assessment - 05/28/20 0849    Subjective Pt. indicated continued improvement and no pain to report.  has not full returned to tennis yet, "trying to be careful."    Diagnostic tests xray shoulder    Patient Stated Goals Reduce pain    Currently in Pain? No/denies    Pain Score 0-No pain    Pain Onset 1 to 4 weeks ago                             [facility] Adult PT Treatment/Exercise - 05/28/20 0001      Exercises   Other Exercises  Review of HEP for attempt at transition to home.       Neck Exercises: Machines for Strengthening   UBE (Upper Arm Bike) Lvl 2.5 2.5 mins alt fwd/back, 10 mins total      Neck Exercises: Theraband   Shoulder Extension Green   3 x 10   Rows Green   3 x10   Shoulder External Rotation Green   3 x10     Neck Exercises: Supine   Other Supine Exercise green band horizontal abd 3 x 10    Other Supine  Exercise UC flexion hold 5 sec x 10                    PT Short Term Goals - 05/28/20 0850      PT SHORT TERM GOAL #1   Title Patient will demonstrate independent use of home exercise program to maintain progress from in clinic treatments.    Time 2    Period Weeks    Status Achieved    Target Date 05/28/20             PT Long Term Goals - 05/28/20 0850      PT LONG TERM GOAL #1   Title Patient will demonstrate/report pain at worst less than or equal to 2/10 to facilitate minimal limitation in daily activity secondary to pain symptoms.    Time 8    Period Weeks    Status On-going    Target Date 07/09/20      PT LONG TERM GOAL #2   Title Patient will demonstrate independent  use of home exercise program to facilitate ability to maintain/progress functional gains from skilled physical therapy services.    Time 8    Period Weeks    Status On-going    Target Date 07/09/20      PT LONG TERM GOAL #3   Title Patient will demonstrate return to work/recreational activity at previous level of function without limitations secondary due to condition    Time 8    Period Weeks    Status On-going    Target Date 07/09/20      PT LONG TERM GOAL #4   Title Pt. will demonstrate cervical AROM WFL s symptoms and limitation for normal mobility at PLOF    Time 8    Period Weeks    Status On-going    Target Date 07/09/20                 Plan - 05/28/20 0907    Clinical Impression Statement Continued marked reduction in pain symptoms and good progress in mobility.  Review of HEP to improve consistency of techniques for attempt at transition to home plan only c return prn within poc dates per Pt. request.    Examination-Activity Limitations Sleep    Examination-Participation Restrictions Other   tennis   Stability/Clinical Decision Making Stable/Uncomplicated    Rehab Potential Good    PT Frequency --   1-2x/week   PT Duration 8 weeks    PT Treatment/Interventions  ADLs/Self Care Home Management;Electrical Stimulation;Iontophoresis 37m/ml Dexamethasone;Moist Heat;Traction;Balance training;Therapeutic exercise;Therapeutic activities;Functional mobility training;Stair training;Gait training;Patient/family education;Ultrasound;Neuromuscular re-education;Manual techniques;Taping;Dry needling;Passive range of motion;Spinal Manipulations;Joint Manipulations    PT Next Visit Plan Transition to HEP, return prn prior to 07/09/2020 (POC date)    PT Home Exercise Plan [id number]   Consulted and Agree with Plan of Care Patient           Patient will benefit from skilled therapeutic intervention in order to improve the following deficits and impairments:  Pain, Impaired UE functional use, Impaired perceived functional ability, Decreased range of motion, Decreased activity tolerance, Increased muscle spasms, Hypomobility  Visit Diagnosis: Acute pain of right shoulder  Cervicalgia  Abnormal posture     Problem List There are no problems to display for this patient.   MScot Jun PT, DPT, OCS, ATC 05/28/20  9:12 AM  PHYSICAL THERAPY DISCHARGE SUMMARY  Visits from Start of Care: 3  Current functional level related to goals / functional outcomes: See note   Remaining deficits: See note   Education / Equipment: HEP Plan: Patient agrees to discharge.  Patient goals were partially met. Patient is being discharged due to not returning since the last visit.  ?????    MScot Jun PT, DPT, OCS, ATC 07/27/20  1:45 PM     [facility] [address] [place] [address]Phone: [phone]  Fax:  [phone] Name: Ashley KERSHAWMRN: [id number] of Birth: 803-14-1947

## 2020-06-02 ENCOUNTER — Encounter: Payer: No Typology Code available for payment source | Admitting: Rehabilitative and Restorative Service Providers"

## 2020-06-09 ENCOUNTER — Encounter: Payer: No Typology Code available for payment source | Admitting: Rehabilitative and Restorative Service Providers"

## 2020-06-16 ENCOUNTER — Encounter: Payer: No Typology Code available for payment source | Admitting: Rehabilitative and Restorative Service Providers"

## 2020-06-16 DIAGNOSIS — R300 Dysuria: Secondary | ICD-10-CM | POA: Diagnosis not present

## 2020-06-22 DIAGNOSIS — H40013 Open angle with borderline findings, low risk, bilateral: Secondary | ICD-10-CM | POA: Diagnosis not present

## 2020-06-23 ENCOUNTER — Encounter: Payer: No Typology Code available for payment source | Admitting: Rehabilitative and Restorative Service Providers"

## 2020-06-25 DIAGNOSIS — Z1231 Encounter for screening mammogram for malignant neoplasm of breast: Secondary | ICD-10-CM | POA: Diagnosis not present

## 2020-07-01 DIAGNOSIS — L821 Other seborrheic keratosis: Secondary | ICD-10-CM | POA: Diagnosis not present

## 2020-07-01 DIAGNOSIS — L570 Actinic keratosis: Secondary | ICD-10-CM | POA: Diagnosis not present

## 2020-07-01 DIAGNOSIS — D692 Other nonthrombocytopenic purpura: Secondary | ICD-10-CM | POA: Diagnosis not present

## 2020-07-01 DIAGNOSIS — D1801 Hemangioma of skin and subcutaneous tissue: Secondary | ICD-10-CM | POA: Diagnosis not present

## 2020-07-01 DIAGNOSIS — D225 Melanocytic nevi of trunk: Secondary | ICD-10-CM | POA: Diagnosis not present

## 2020-07-01 DIAGNOSIS — Z85820 Personal history of malignant melanoma of skin: Secondary | ICD-10-CM | POA: Diagnosis not present

## 2020-07-01 DIAGNOSIS — L814 Other melanin hyperpigmentation: Secondary | ICD-10-CM | POA: Diagnosis not present

## 2020-07-01 DIAGNOSIS — D3611 Benign neoplasm of peripheral nerves and autonomic nervous system of face, head, and neck: Secondary | ICD-10-CM | POA: Diagnosis not present

## 2020-07-15 DIAGNOSIS — Z23 Encounter for immunization: Secondary | ICD-10-CM | POA: Diagnosis not present

## 2020-10-29 DIAGNOSIS — M810 Age-related osteoporosis without current pathological fracture: Secondary | ICD-10-CM | POA: Diagnosis not present

## 2020-12-22 DIAGNOSIS — H40013 Open angle with borderline findings, low risk, bilateral: Secondary | ICD-10-CM | POA: Diagnosis not present

## 2021-06-01 DIAGNOSIS — E785 Hyperlipidemia, unspecified: Secondary | ICD-10-CM | POA: Diagnosis not present

## 2021-06-01 DIAGNOSIS — M810 Age-related osteoporosis without current pathological fracture: Secondary | ICD-10-CM | POA: Diagnosis not present

## 2021-06-08 DIAGNOSIS — Z0000 Encounter for general adult medical examination without abnormal findings: Secondary | ICD-10-CM | POA: Diagnosis not present

## 2021-06-08 DIAGNOSIS — G4700 Insomnia, unspecified: Secondary | ICD-10-CM | POA: Diagnosis not present

## 2021-06-08 DIAGNOSIS — Z1331 Encounter for screening for depression: Secondary | ICD-10-CM | POA: Diagnosis not present

## 2021-06-08 DIAGNOSIS — M25511 Pain in right shoulder: Secondary | ICD-10-CM | POA: Diagnosis not present

## 2021-06-08 DIAGNOSIS — Z23 Encounter for immunization: Secondary | ICD-10-CM | POA: Diagnosis not present

## 2021-06-08 DIAGNOSIS — E785 Hyperlipidemia, unspecified: Secondary | ICD-10-CM | POA: Diagnosis not present

## 2021-06-08 DIAGNOSIS — R82998 Other abnormal findings in urine: Secondary | ICD-10-CM | POA: Diagnosis not present

## 2021-06-08 DIAGNOSIS — M810 Age-related osteoporosis without current pathological fracture: Secondary | ICD-10-CM | POA: Diagnosis not present

## 2021-06-08 DIAGNOSIS — C4330 Malignant melanoma of unspecified part of face: Secondary | ICD-10-CM | POA: Diagnosis not present

## 2021-07-01 DIAGNOSIS — Z1231 Encounter for screening mammogram for malignant neoplasm of breast: Secondary | ICD-10-CM | POA: Diagnosis not present

## 2021-07-12 DIAGNOSIS — H40023 Open angle with borderline findings, high risk, bilateral: Secondary | ICD-10-CM | POA: Diagnosis not present

## 2021-08-12 DIAGNOSIS — L570 Actinic keratosis: Secondary | ICD-10-CM | POA: Diagnosis not present

## 2021-08-12 DIAGNOSIS — L814 Other melanin hyperpigmentation: Secondary | ICD-10-CM | POA: Diagnosis not present

## 2021-08-12 DIAGNOSIS — D2272 Melanocytic nevi of left lower limb, including hip: Secondary | ICD-10-CM | POA: Diagnosis not present

## 2021-08-12 DIAGNOSIS — L821 Other seborrheic keratosis: Secondary | ICD-10-CM | POA: Diagnosis not present

## 2021-08-12 DIAGNOSIS — D692 Other nonthrombocytopenic purpura: Secondary | ICD-10-CM | POA: Diagnosis not present

## 2021-08-12 DIAGNOSIS — D3611 Benign neoplasm of peripheral nerves and autonomic nervous system of face, head, and neck: Secondary | ICD-10-CM | POA: Diagnosis not present

## 2021-08-12 DIAGNOSIS — Z85820 Personal history of malignant melanoma of skin: Secondary | ICD-10-CM | POA: Diagnosis not present

## 2021-08-12 DIAGNOSIS — D1801 Hemangioma of skin and subcutaneous tissue: Secondary | ICD-10-CM | POA: Diagnosis not present

## 2021-11-16 DIAGNOSIS — H40023 Open angle with borderline findings, high risk, bilateral: Secondary | ICD-10-CM | POA: Diagnosis not present

## 2022-01-11 DIAGNOSIS — L570 Actinic keratosis: Secondary | ICD-10-CM | POA: Diagnosis not present

## 2022-01-18 DIAGNOSIS — H40023 Open angle with borderline findings, high risk, bilateral: Secondary | ICD-10-CM | POA: Diagnosis not present

## 2022-03-24 DIAGNOSIS — L821 Other seborrheic keratosis: Secondary | ICD-10-CM | POA: Diagnosis not present

## 2022-03-24 DIAGNOSIS — D1801 Hemangioma of skin and subcutaneous tissue: Secondary | ICD-10-CM | POA: Diagnosis not present

## 2022-03-24 DIAGNOSIS — Z85820 Personal history of malignant melanoma of skin: Secondary | ICD-10-CM | POA: Diagnosis not present

## 2022-03-24 DIAGNOSIS — I788 Other diseases of capillaries: Secondary | ICD-10-CM | POA: Diagnosis not present

## 2022-03-24 DIAGNOSIS — L814 Other melanin hyperpigmentation: Secondary | ICD-10-CM | POA: Diagnosis not present

## 2022-03-24 DIAGNOSIS — L565 Disseminated superficial actinic porokeratosis (DSAP): Secondary | ICD-10-CM | POA: Diagnosis not present

## 2022-03-24 DIAGNOSIS — D692 Other nonthrombocytopenic purpura: Secondary | ICD-10-CM | POA: Diagnosis not present

## 2022-05-26 DIAGNOSIS — H40023 Open angle with borderline findings, high risk, bilateral: Secondary | ICD-10-CM | POA: Diagnosis not present

## 2022-06-17 DIAGNOSIS — M810 Age-related osteoporosis without current pathological fracture: Secondary | ICD-10-CM | POA: Diagnosis not present

## 2022-06-17 DIAGNOSIS — E785 Hyperlipidemia, unspecified: Secondary | ICD-10-CM | POA: Diagnosis not present

## 2022-06-17 DIAGNOSIS — R7989 Other specified abnormal findings of blood chemistry: Secondary | ICD-10-CM | POA: Diagnosis not present

## 2022-06-24 DIAGNOSIS — M810 Age-related osteoporosis without current pathological fracture: Secondary | ICD-10-CM | POA: Diagnosis not present

## 2022-06-24 DIAGNOSIS — M25511 Pain in right shoulder: Secondary | ICD-10-CM | POA: Diagnosis not present

## 2022-06-24 DIAGNOSIS — C4330 Malignant melanoma of unspecified part of face: Secondary | ICD-10-CM | POA: Diagnosis not present

## 2022-06-24 DIAGNOSIS — Z0000 Encounter for general adult medical examination without abnormal findings: Secondary | ICD-10-CM | POA: Diagnosis not present

## 2022-06-24 DIAGNOSIS — E785 Hyperlipidemia, unspecified: Secondary | ICD-10-CM | POA: Diagnosis not present

## 2022-06-24 DIAGNOSIS — G4700 Insomnia, unspecified: Secondary | ICD-10-CM | POA: Diagnosis not present

## 2022-06-24 DIAGNOSIS — R82998 Other abnormal findings in urine: Secondary | ICD-10-CM | POA: Diagnosis not present

## 2022-06-29 ENCOUNTER — Encounter: Payer: Self-pay | Admitting: Internal Medicine

## 2022-07-06 ENCOUNTER — Encounter: Payer: Self-pay | Admitting: Internal Medicine

## 2022-07-07 DIAGNOSIS — Z1231 Encounter for screening mammogram for malignant neoplasm of breast: Secondary | ICD-10-CM | POA: Diagnosis not present

## 2022-08-29 ENCOUNTER — Ambulatory Visit (AMBULATORY_SURGERY_CENTER): Payer: Self-pay | Admitting: *Deleted

## 2022-08-29 VITALS — Ht 63.0 in | Wt 138.0 lb

## 2022-08-29 DIAGNOSIS — Z1211 Encounter for screening for malignant neoplasm of colon: Secondary | ICD-10-CM

## 2022-08-29 MED ORDER — NA SULFATE-K SULFATE-MG SULF 17.5-3.13-1.6 GM/177ML PO SOLN: 1.0000 | Freq: Once | ORAL | 0 refills | Status: AC

## 2022-08-29 NOTE — Progress Notes (Signed)

## 2022-09-12 ENCOUNTER — Ambulatory Visit (AMBULATORY_SURGERY_CENTER): Payer: Medicare Other | Admitting: Internal Medicine

## 2022-09-12 ENCOUNTER — Encounter: Payer: Self-pay | Admitting: Internal Medicine

## 2022-09-12 VITALS — BP 140/73 | HR 78 | Temp 97.5°F | Resp 14 | Ht 63.0 in | Wt 138.0 lb

## 2022-09-12 DIAGNOSIS — Z1211 Encounter for screening for malignant neoplasm of colon: Secondary | ICD-10-CM

## 2022-09-12 MED ORDER — SODIUM CHLORIDE 0.9 % IV SOLN: 500.0000 mL | Freq: Once | INTRAVENOUS | Status: AC

## 2022-09-12 NOTE — Progress Notes (Signed)
HISTORY OF PRESENT ILLNESS:  Ashley Hines is a 76 y.o. female presents today for colon cancer screening.  Previous examinations 2003 and 2013 were both negative for neoplasia.  No complaints  REVIEW OF SYSTEMS:  All non-GI ROS negative.. Past Medical History:  Diagnosis Date   Melanoma ([place]) 2009   face   Osteopenia     Past Surgical History:  Procedure Laterality Date   [place]   COLONOSCOPY     X 2   MELANOMA EXCISION  10/11/2007   face    Social History Ashley Hines  reports that she has never smoked. She has never been exposed to tobacco smoke. She has never used smokeless tobacco. She reports current alcohol use of about 7.0 standard drinks of alcohol per week. She reports that she does not use drugs.  family history includes Rectal cancer in her brother.  No Known Allergies     PHYSICAL EXAMINATION: Vital signs: BP 135/83   Pulse 78   Temp (!) 97.5 F (36.4 C)   Ht '5\' 3"'$  (1.6 m)   Wt 138 lb (62.6 kg)   SpO2 97%   BMI 24.45 kg/m  General: Well-developed, well-nourished, no acute distress HEENT: Sclerae are anicteric, conjunctiva pink. Oral mucosa intact Lungs: Clear Heart: Regular Abdomen: soft, nontender, nondistended, no obvious ascites, no peritoneal signs, normal bowel sounds. No organomegaly. Extremities: No edema Psychiatric: alert and oriented x3. Cooperative     ASSESSMENT:  Colon cancer screening   PLAN:  Screening colonoscopy

## 2022-09-12 NOTE — Op Note (Signed)
[place] Patient Name: Ashley Hines Procedure Date: 09/12/2022 11:07 AM MRN: [id number] Endoscopist: Docia Chuck. Henrene Pastor , MD, [id number] Age: 76 Referring MD:  Date of Birth: [birth year] Gender: Female Account #: [id number] Procedure:                Colonoscopy Indications:              Screening for colorectal malignant neoplasm.                            Previous screening examinations 2003 and 2013 were                            negative for neoplasia Medicines:                Monitored Anesthesia Care Procedure:                Pre-Anesthesia Assessment:                           - Prior to the procedure, a History and Physical                            was performed, and patient medications and                            allergies were reviewed. The patient's tolerance of                            previous anesthesia was also reviewed. The risks                            and benefits of the procedure and the sedation                            options and risks were discussed with the patient.                            All questions were answered, and informed consent                            was obtained. Prior Anticoagulants: The patient has                            taken no anticoagulant or antiplatelet agents. ASA                            Grade Assessment: II - A patient with mild systemic                            disease. After reviewing the risks and benefits,                            the patient was deemed in satisfactory condition to  undergo the procedure.                           After obtaining informed consent, the colonoscope                            was passed under direct vision. Throughout the                            procedure, the patient's blood pressure, pulse, and                            oxygen saturations were monitored continuously. The                            CF [id number] #[id number] was introduced  through the anus                            and advanced to the the cecum, identified by                            appendiceal orifice and ileocecal valve. The                            ileocecal valve, appendiceal orifice, and rectum                            were photographed. The quality of the bowel                            preparation was excellent. The colonoscopy was                            performed without difficulty. The patient tolerated                            the procedure well. The bowel preparation used was                            SUPREP via split dose instruction. Scope In: 11:15:32 AM Scope Out: 11:30:32 AM Scope Withdrawal Time: 0 hours 8 minutes 1 second  Total Procedure Duration: 0 hours 15 minutes 0 seconds  Findings:                 Diverticula were found in the sigmoid colon.                           The exam was otherwise without abnormality on                            direct and retroflexion views. Complications:            No immediate complications. Estimated blood loss:  None. Estimated Blood Loss:     Estimated blood loss: none. Impression:               - Diverticulosis in the sigmoid colon.                           - The examination was otherwise normal on direct                            and retroflexion views.                           - No specimens collected. Recommendation:           - Repeat colonoscopy is not recommended for                            surveillance.                           - Patient has a contact number available for                            emergencies. The signs and symptoms of potential                            delayed complications were discussed with the                            patient. Return to normal activities tomorrow.                            Written discharge instructions were provided to the                            patient.                           - Resume  previous diet.                           - Continue present medications. Docia Chuck. Henrene Pastor, MD 09/12/2022 11:37:03 AM This report has been signed electronically.

## 2022-09-12 NOTE — Patient Instructions (Signed)
    Handout on Diverticulosis given to you today   YOU HAD AN ENDOSCOPIC PROCEDURE TODAY AT [place]:   Refer to the procedure report that was given to you for any specific questions about what was found during the examination.  If the procedure report does not answer your questions, please call your gastroenterologist to clarify.  If you requested that your care partner not be given the details of your procedure findings, then the procedure report has been included in a sealed envelope for you to review at your convenience later.  YOU SHOULD EXPECT: Some feelings of bloating in the abdomen. Passage of more gas than usual.  Walking can help get rid of the air that was put into your GI tract during the procedure and reduce the bloating. If you had a lower endoscopy (such as a colonoscopy or flexible sigmoidoscopy) you may notice spotting of blood in your stool or on the toilet paper. If you underwent a bowel prep for your procedure, you may not have a normal bowel movement for a few days.  Please Note:  You might notice some irritation and congestion in your nose or some drainage.  This is from the oxygen used during your procedure.  There is no need for concern and it should clear up in a day or so.  SYMPTOMS TO REPORT IMMEDIATELY:  Following lower endoscopy (colonoscopy or flexible sigmoidoscopy):  Excessive amounts of blood in the stool  Significant tenderness or worsening of abdominal pains  Swelling of the abdomen that is new, acute  Fever of 100F or higher   For urgent or emergent issues, a gastroenterologist can be reached at any hour by calling [phone]. Do not use MyChart messaging for urgent concerns.    DIET:  We do recommend a small meal at first, but then you may proceed to your regular diet.  Drink plenty of fluids but you should avoid alcoholic beverages for 24 hours.  ACTIVITY:  You should plan to take it easy for the rest of today and you should  NOT DRIVE or use heavy machinery until tomorrow (because of the sedation medicines used during the test).    FOLLOW UP: Our staff will call the number listed on your records the next business day following your procedure.  We will call around 7:15- 8:00 am to check on you and address any questions or concerns that you may have regarding the information given to you following your procedure. If we do not reach you, we will leave a message.     If any biopsies were taken you will be contacted by phone or by letter within the next 1-3 weeks.  Please call us at [phone] if you have not heard about the biopsies in 3 weeks.    SIGNATURES/CONFIDENTIALITY: You and/or your care partner have signed paperwork which will be entered into your electronic medical record.  These signatures attest to the fact that that the information above on your After Visit Summary has been reviewed and is understood.  Full responsibility of the confidentiality of this discharge information lies with you and/or your care-partner.

## 2022-09-12 NOTE — Progress Notes (Signed)
Sedate, gd SR, tolerated procedure well, VSS, report to RN 

## 2022-09-12 NOTE — Progress Notes (Signed)
Pt's states no medical or surgical changes since previsit or office visit. 

## 2022-09-13 ENCOUNTER — Telehealth: Payer: Self-pay

## 2022-09-13 NOTE — Telephone Encounter (Signed)
  Follow up Call-     09/12/2022   10:36 AM  Call back number  Post procedure Call Back phone  # [phone]  Permission to leave phone message Yes     Patient questions:  Do you have a fever, pain , or abdominal swelling? No. Pain Score  0 *  Have you tolerated food without any problems? Yes.    Have you been able to return to your normal activities? Yes.    Do you have any questions about your discharge instructions: Diet   No. Medications  No. Follow up visit  No.  Do you have questions or concerns about your Care? No.  Actions: * If pain score is 4 or above: No action needed, pain <4.

## 2022-09-27 DIAGNOSIS — H40023 Open angle with borderline findings, high risk, bilateral: Secondary | ICD-10-CM | POA: Diagnosis not present

## 2023-01-30 DIAGNOSIS — H40023 Open angle with borderline findings, high risk, bilateral: Secondary | ICD-10-CM | POA: Diagnosis not present

## 2023-01-30 DIAGNOSIS — H40053 Ocular hypertension, bilateral: Secondary | ICD-10-CM | POA: Diagnosis not present

## 2023-05-16 DIAGNOSIS — D1801 Hemangioma of skin and subcutaneous tissue: Secondary | ICD-10-CM | POA: Diagnosis not present

## 2023-05-16 DIAGNOSIS — L814 Other melanin hyperpigmentation: Secondary | ICD-10-CM | POA: Diagnosis not present

## 2023-05-16 DIAGNOSIS — Z85820 Personal history of malignant melanoma of skin: Secondary | ICD-10-CM | POA: Diagnosis not present

## 2023-05-16 DIAGNOSIS — L821 Other seborrheic keratosis: Secondary | ICD-10-CM | POA: Diagnosis not present

## 2023-06-06 DIAGNOSIS — H40023 Open angle with borderline findings, high risk, bilateral: Secondary | ICD-10-CM | POA: Diagnosis not present

## 2023-06-27 DIAGNOSIS — R7989 Other specified abnormal findings of blood chemistry: Secondary | ICD-10-CM | POA: Diagnosis not present

## 2023-06-27 DIAGNOSIS — E785 Hyperlipidemia, unspecified: Secondary | ICD-10-CM | POA: Diagnosis not present

## 2023-06-27 DIAGNOSIS — M810 Age-related osteoporosis without current pathological fracture: Secondary | ICD-10-CM | POA: Diagnosis not present

## 2023-07-04 DIAGNOSIS — C4330 Malignant melanoma of unspecified part of face: Secondary | ICD-10-CM | POA: Diagnosis not present

## 2023-07-04 DIAGNOSIS — G4700 Insomnia, unspecified: Secondary | ICD-10-CM | POA: Diagnosis not present

## 2023-07-04 DIAGNOSIS — Z23 Encounter for immunization: Secondary | ICD-10-CM | POA: Diagnosis not present

## 2023-07-04 DIAGNOSIS — R82998 Other abnormal findings in urine: Secondary | ICD-10-CM | POA: Diagnosis not present

## 2023-07-04 DIAGNOSIS — M25511 Pain in right shoulder: Secondary | ICD-10-CM | POA: Diagnosis not present

## 2023-07-04 DIAGNOSIS — M810 Age-related osteoporosis without current pathological fracture: Secondary | ICD-10-CM | POA: Diagnosis not present

## 2023-07-04 DIAGNOSIS — Z1339 Encounter for screening examination for other mental health and behavioral disorders: Secondary | ICD-10-CM | POA: Diagnosis not present

## 2023-07-04 DIAGNOSIS — E785 Hyperlipidemia, unspecified: Secondary | ICD-10-CM | POA: Diagnosis not present

## 2023-07-04 DIAGNOSIS — Z1331 Encounter for screening for depression: Secondary | ICD-10-CM | POA: Diagnosis not present

## 2023-07-04 DIAGNOSIS — Z0000 Encounter for general adult medical examination without abnormal findings: Secondary | ICD-10-CM | POA: Diagnosis not present

## 2023-07-13 DIAGNOSIS — Z1231 Encounter for screening mammogram for malignant neoplasm of breast: Secondary | ICD-10-CM | POA: Diagnosis not present

## 2023-10-09 DIAGNOSIS — H40053 Ocular hypertension, bilateral: Secondary | ICD-10-CM | POA: Diagnosis not present

## 2023-10-23 DIAGNOSIS — H40023 Open angle with borderline findings, high risk, bilateral: Secondary | ICD-10-CM | POA: Diagnosis not present

## 2024-01-18 DIAGNOSIS — L821 Other seborrheic keratosis: Secondary | ICD-10-CM | POA: Diagnosis not present

## 2024-01-22 DIAGNOSIS — H401131 Primary open-angle glaucoma, bilateral, mild stage: Secondary | ICD-10-CM | POA: Diagnosis not present

## 2024-05-14 ENCOUNTER — Ambulatory Visit: Admitting: Podiatry

## 2024-05-21 ENCOUNTER — Ambulatory Visit (INDEPENDENT_AMBULATORY_CARE_PROVIDER_SITE_OTHER): Admitting: Podiatry

## 2024-05-21 DIAGNOSIS — L603 Nail dystrophy: Secondary | ICD-10-CM | POA: Diagnosis not present

## 2024-05-21 DIAGNOSIS — B351 Tinea unguium: Secondary | ICD-10-CM

## 2024-05-22 NOTE — Progress Notes (Signed)
  Subjective:  Patient ID: Ashley Hines, female    DOB: 05-23-1946,  MRN: [id number] HPI Chief Complaint  Patient presents with   Nail Problem    NP- L foot 1st toenail turning dark    78 y.o. female presents with the above complaint.   ROS: Denies fever chills nausea mobic  muscle aches pains calf pain back pain chest pain shortness of breath.  Past Medical History:  Diagnosis Date   Melanoma [facility]) 2009   face   Osteopenia    Past Surgical History:  Procedure Laterality Date   CESAREAN SECTION  1983, 1986   COLONOSCOPY     X 2   MELANOMA EXCISION  10/11/2007   face    Current Outpatient Medications:    VYZULTA 0.024 % SOLN, Apply 1 drop to eye daily., Disp: , Rfl:    calcium carbonate (OS-CAL) 600 MG TABS, Take 600 mg by mouth 2 (two) times daily with a meal., Disp: , Rfl:    ibuprofen (ADVIL,MOTRIN) 200 MG tablet, Take 200 mg by mouth every 6 (six) hours as needed., Disp: , Rfl:    rosuvastatin (CRESTOR) 5 MG tablet, Take 5 mg by mouth 2 (two) times a week., Disp: , Rfl:    zolpidem (AMBIEN) 5 MG tablet, Take 5 mg by mouth at bedtime as needed. , Disp: , Rfl:   Current Facility-Administered Medications:    0.9 %  sodium chloride  infusion, 500 mL, Intravenous, Once, Abran Norleen SAILOR, MD  No Known Allergies Review of Systems Objective:  There were no vitals filed for this visit.  General: Well developed, nourished, in no acute distress, alert and oriented x3   Dermatological: Skin is warm, dry and supple bilateral. Nails x 10 are well maintained; remaining integument appears unremarkable at this time. There are no open sores, no preulcerative lesions, no rash or signs of infection present.  Hallux nail plate left is starting to turn thick and discolored.  Vascular: Dorsalis Pedis artery and Posterior Tibial artery pedal pulses are 2/4 bilateral with immedate capillary fill time. Pedal hair growth present. No varicosities and no lower extremity edema present bilateral.    Neruologic: Grossly intact via light touch bilateral. Vibratory intact via tuning fork bilateral. Protective threshold with Semmes Wienstein monofilament intact to all pedal sites bilateral. Patellar and Achilles deep tendon reflexes 2+ bilateral. No Babinski or clonus noted bilateral.   Musculoskeletal: No gross boney pedal deformities bilateral. No pain, crepitus, or limitation noted with foot and ankle range of motion bilateral. Muscular strength 5/5 in all groups tested bilateral.  Gait: Unassisted, Nonantalgic.    Radiographs:  None taken  Assessment & Plan:   Assessment: Nail dystrophy trying to rule out onychomycosis  Plan: Samples of the skin and nail were taken today for pathologic evaluation we will follow-up with her in 1 month     Shaneil Yazdi T. [place], [place]

## 2024-06-03 ENCOUNTER — Other Ambulatory Visit: Payer: Self-pay | Admitting: Podiatry

## 2024-06-13 DIAGNOSIS — L821 Other seborrheic keratosis: Secondary | ICD-10-CM | POA: Diagnosis not present

## 2024-06-13 DIAGNOSIS — D1801 Hemangioma of skin and subcutaneous tissue: Secondary | ICD-10-CM | POA: Diagnosis not present

## 2024-06-13 DIAGNOSIS — L570 Actinic keratosis: Secondary | ICD-10-CM | POA: Diagnosis not present

## 2024-06-13 DIAGNOSIS — I788 Other diseases of capillaries: Secondary | ICD-10-CM | POA: Diagnosis not present

## 2024-06-13 DIAGNOSIS — L814 Other melanin hyperpigmentation: Secondary | ICD-10-CM | POA: Diagnosis not present

## 2024-06-13 DIAGNOSIS — Z85820 Personal history of malignant melanoma of skin: Secondary | ICD-10-CM | POA: Diagnosis not present

## 2024-06-20 ENCOUNTER — Ambulatory Visit: Admitting: Podiatry

## 2024-06-25 ENCOUNTER — Ambulatory Visit (INDEPENDENT_AMBULATORY_CARE_PROVIDER_SITE_OTHER): Admitting: Podiatry

## 2024-06-25 ENCOUNTER — Encounter: Payer: Self-pay | Admitting: Podiatry

## 2024-06-25 DIAGNOSIS — B351 Tinea unguium: Secondary | ICD-10-CM | POA: Diagnosis not present

## 2024-06-25 MED ORDER — TERBINAFINE HCL 250 MG PO TABS: 250.0000 mg | ORAL_TABLET | Freq: Every day | ORAL | 0 refills | Status: AC

## 2024-06-26 NOTE — Progress Notes (Signed)
 She presents today concerned about her toenails.  Objective: Nail pathology demonstrates onychomycosis nail dystrophy dermatophytic fungus.  Assessment: Onychomycosis.  Plan: Discussed etiology pathology and surgical therapies at this point we discussed topical therapy laser therapy and oral therapy.  She would like to go ahead and try the oral therapy.  We discussed the pros and cons of use this medication possible side effects and complications.  She understands and is amenable to it.  We will follow-up with her in 1 month for blood work.  We did start her on Lamisil  250 mg tablets.  She will take 1 tablet by mouth daily notify me with any side effects or allergic reactions.

## 2024-07-11 DIAGNOSIS — Z1389 Encounter for screening for other disorder: Secondary | ICD-10-CM | POA: Diagnosis not present

## 2024-07-11 DIAGNOSIS — Z1212 Encounter for screening for malignant neoplasm of rectum: Secondary | ICD-10-CM | POA: Diagnosis not present

## 2024-07-11 DIAGNOSIS — E785 Hyperlipidemia, unspecified: Secondary | ICD-10-CM | POA: Diagnosis not present

## 2024-07-11 DIAGNOSIS — E7849 Other hyperlipidemia: Secondary | ICD-10-CM | POA: Diagnosis not present

## 2024-07-11 DIAGNOSIS — M810 Age-related osteoporosis without current pathological fracture: Secondary | ICD-10-CM | POA: Diagnosis not present

## 2024-07-17 DIAGNOSIS — Z1331 Encounter for screening for depression: Secondary | ICD-10-CM | POA: Diagnosis not present

## 2024-07-17 DIAGNOSIS — Z1339 Encounter for screening examination for other mental health and behavioral disorders: Secondary | ICD-10-CM | POA: Diagnosis not present

## 2024-07-17 DIAGNOSIS — Z0000 Encounter for general adult medical examination without abnormal findings: Secondary | ICD-10-CM | POA: Diagnosis not present

## 2024-07-17 DIAGNOSIS — M25511 Pain in right shoulder: Secondary | ICD-10-CM | POA: Diagnosis not present

## 2024-07-17 DIAGNOSIS — E785 Hyperlipidemia, unspecified: Secondary | ICD-10-CM | POA: Diagnosis not present

## 2024-07-17 DIAGNOSIS — C4330 Malignant melanoma of unspecified part of face: Secondary | ICD-10-CM | POA: Diagnosis not present

## 2024-07-17 DIAGNOSIS — G4700 Insomnia, unspecified: Secondary | ICD-10-CM | POA: Diagnosis not present

## 2024-07-17 DIAGNOSIS — M810 Age-related osteoporosis without current pathological fracture: Secondary | ICD-10-CM | POA: Diagnosis not present

## 2024-07-17 DIAGNOSIS — B351 Tinea unguium: Secondary | ICD-10-CM | POA: Diagnosis not present

## 2024-07-17 DIAGNOSIS — R82998 Other abnormal findings in urine: Secondary | ICD-10-CM | POA: Diagnosis not present

## 2024-07-17 DIAGNOSIS — H409 Unspecified glaucoma: Secondary | ICD-10-CM | POA: Diagnosis not present

## 2024-07-17 DIAGNOSIS — Z23 Encounter for immunization: Secondary | ICD-10-CM | POA: Diagnosis not present

## 2024-07-18 DIAGNOSIS — Z1231 Encounter for screening mammogram for malignant neoplasm of breast: Secondary | ICD-10-CM | POA: Diagnosis not present

## 2024-07-23 DIAGNOSIS — H401131 Primary open-angle glaucoma, bilateral, mild stage: Secondary | ICD-10-CM | POA: Diagnosis not present

## 2024-07-25 ENCOUNTER — Ambulatory Visit: Admitting: Podiatry

## 2024-07-25 DIAGNOSIS — B351 Tinea unguium: Secondary | ICD-10-CM | POA: Diagnosis not present

## 2024-07-25 DIAGNOSIS — Z79899 Other long term (current) drug therapy: Secondary | ICD-10-CM | POA: Diagnosis not present

## 2024-07-25 MED ORDER — TERBINAFINE HCL 250 MG PO TABS: 250.0000 mg | ORAL_TABLET | Freq: Every day | ORAL | 0 refills | Status: AC

## 2024-07-25 NOTE — Progress Notes (Signed)
 She presents today for a follow-up of her Lamisil  therapy she has completed 30 days of Lamisil  stating that she is having no issues taking it.  Has just seen Dr. Onita for her annual physical blood work was done recently no changes in her liver profile unnoticeable.  Objective: No change in physical exam.  Assessment: Long-term therapy, medication Lamisil  for onychomycosis.  Plan: Continue another 90 days of Lamisil  250 mg tablets.  1 p.o. daily.  Follow-up with her in 4 months

## 2024-11-26 ENCOUNTER — Ambulatory Visit: Admitting: Podiatry
